# Patient Record
Sex: Male | Born: 1955 | Race: White | Hispanic: No | Marital: Married | Smoking: Never smoker
Health system: Southern US, Community
[De-identification: ages and names within clinical notes are randomized; demographics above are authoritative.]

## PROBLEM LIST (undated history)

## (undated) DIAGNOSIS — E7800 Pure hypercholesterolemia, unspecified: Secondary | ICD-10-CM

## (undated) DIAGNOSIS — M1990 Unspecified osteoarthritis, unspecified site: Secondary | ICD-10-CM

## (undated) DIAGNOSIS — I10 Essential (primary) hypertension: Secondary | ICD-10-CM

## (undated) DIAGNOSIS — F502 Bulimia nervosa, unspecified: Secondary | ICD-10-CM

## (undated) DIAGNOSIS — F419 Anxiety disorder, unspecified: Secondary | ICD-10-CM

## (undated) DIAGNOSIS — F319 Bipolar disorder, unspecified: Secondary | ICD-10-CM

## (undated) DIAGNOSIS — M48061 Spinal stenosis, lumbar region without neurogenic claudication: Secondary | ICD-10-CM

## (undated) DIAGNOSIS — Z87442 Personal history of urinary calculi: Secondary | ICD-10-CM

## (undated) DIAGNOSIS — K219 Gastro-esophageal reflux disease without esophagitis: Secondary | ICD-10-CM

## (undated) DIAGNOSIS — C439 Malignant melanoma of skin, unspecified: Secondary | ICD-10-CM

## (undated) DIAGNOSIS — Z8614 Personal history of Methicillin resistant Staphylococcus aureus infection: Secondary | ICD-10-CM

## (undated) DIAGNOSIS — G4730 Sleep apnea, unspecified: Secondary | ICD-10-CM

## (undated) HISTORY — PX: FOOT SURGERY: SHX648

## (undated) HISTORY — PX: ABDOMINAL SURGERY: SHX537

## (undated) HISTORY — PX: APPENDECTOMY: SHX54

## (undated) HISTORY — PX: SHOULDER SURGERY: SHX246

## (undated) HISTORY — PX: OTHER SURGICAL HISTORY: SHX169

## (undated) HISTORY — PX: EYE SURGERY: SHX253

## (undated) HISTORY — DX: Malignant melanoma of skin, unspecified: C43.9

## (undated) HISTORY — PX: VASECTOMY: SHX75

## (undated) HISTORY — DX: Spinal stenosis, lumbar region without neurogenic claudication: M48.061

---

## 2005-04-16 DIAGNOSIS — Z8614 Personal history of Methicillin resistant Staphylococcus aureus infection: Secondary | ICD-10-CM

## 2005-04-16 HISTORY — DX: Personal history of Methicillin resistant Staphylococcus aureus infection: Z86.14

## 2013-05-27 DIAGNOSIS — G4733 Obstructive sleep apnea (adult) (pediatric): Secondary | ICD-10-CM | POA: Insufficient documentation

## 2013-05-27 DIAGNOSIS — G5711 Meralgia paresthetica, right lower limb: Secondary | ICD-10-CM | POA: Insufficient documentation

## 2013-09-25 DIAGNOSIS — E669 Obesity, unspecified: Secondary | ICD-10-CM | POA: Insufficient documentation

## 2014-04-13 DIAGNOSIS — IMO0002 Reserved for concepts with insufficient information to code with codable children: Secondary | ICD-10-CM

## 2014-04-13 HISTORY — DX: Reserved for concepts with insufficient information to code with codable children: IMO0002

## 2015-01-17 DIAGNOSIS — Z85820 Personal history of malignant melanoma of skin: Secondary | ICD-10-CM | POA: Insufficient documentation

## 2016-11-18 ENCOUNTER — Inpatient Hospital Stay (HOSPITAL_COMMUNITY)
Admission: AD | Admit: 2016-11-18 | Discharge: 2016-11-21 | DRG: 885 | Disposition: A | Discharge status: Home / Self Care | Payer: 59 | Source: Intra-hospital | Attending: Psychiatry | Admitting: Psychiatry

## 2016-11-18 ENCOUNTER — Encounter (HOSPITAL_COMMUNITY): Payer: Self-pay

## 2016-11-18 ENCOUNTER — Emergency Department (HOSPITAL_COMMUNITY)
Admission: EM | Admit: 2016-11-18 | Discharge: 2016-11-18 | Disposition: A | Discharge status: Other Acute Inpatient Hospital | Payer: Self-pay | Attending: Emergency Medicine | Admitting: Emergency Medicine

## 2016-11-18 DIAGNOSIS — G4700 Insomnia, unspecified: Secondary | ICD-10-CM | POA: Diagnosis present

## 2016-11-18 DIAGNOSIS — I10 Essential (primary) hypertension: Secondary | ICD-10-CM | POA: Diagnosis present

## 2016-11-18 DIAGNOSIS — R45851 Suicidal ideations: Secondary | ICD-10-CM | POA: Insufficient documentation

## 2016-11-18 DIAGNOSIS — Z9049 Acquired absence of other specified parts of digestive tract: Secondary | ICD-10-CM | POA: Diagnosis not present

## 2016-11-18 DIAGNOSIS — F315 Bipolar disorder, current episode depressed, severe, with psychotic features: Principal | ICD-10-CM | POA: Diagnosis present

## 2016-11-18 DIAGNOSIS — F502 Bulimia nervosa: Secondary | ICD-10-CM | POA: Diagnosis present

## 2016-11-18 DIAGNOSIS — Z882 Allergy status to sulfonamides status: Secondary | ICD-10-CM | POA: Diagnosis not present

## 2016-11-18 DIAGNOSIS — Z79899 Other long term (current) drug therapy: Secondary | ICD-10-CM

## 2016-11-18 DIAGNOSIS — Z7982 Long term (current) use of aspirin: Secondary | ICD-10-CM

## 2016-11-18 DIAGNOSIS — Z9889 Other specified postprocedural states: Secondary | ICD-10-CM | POA: Diagnosis not present

## 2016-11-18 DIAGNOSIS — F17210 Nicotine dependence, cigarettes, uncomplicated: Secondary | ICD-10-CM | POA: Diagnosis present

## 2016-11-18 DIAGNOSIS — F329 Major depressive disorder, single episode, unspecified: Secondary | ICD-10-CM | POA: Insufficient documentation

## 2016-11-18 HISTORY — DX: Essential (primary) hypertension: I10

## 2016-11-18 HISTORY — DX: Pure hypercholesterolemia, unspecified: E78.00

## 2016-11-18 HISTORY — DX: Bulimia nervosa: F50.2

## 2016-11-18 HISTORY — DX: Bipolar disorder, unspecified: F31.9

## 2016-11-18 HISTORY — DX: Bulimia nervosa, unspecified: F50.20

## 2016-11-18 LAB — URINALYSIS, ROUTINE W REFLEX MICROSCOPIC
Bilirubin Urine: NEGATIVE
Glucose, UA: NEGATIVE mg/dL
Hgb urine dipstick: NEGATIVE
Ketones, ur: NEGATIVE mg/dL
Leukocytes, UA: NEGATIVE
Nitrite: NEGATIVE
Protein, ur: NEGATIVE mg/dL
Specific Gravity, Urine: 1.014 (ref 1.005–1.030)
pH: 5 (ref 5.0–8.0)

## 2016-11-18 LAB — RAPID URINE DRUG SCREEN, HOSP PERFORMED
Amphetamines: NOT DETECTED
Barbiturates: NOT DETECTED
Benzodiazepines: NOT DETECTED
Cocaine: NOT DETECTED
Opiates: NOT DETECTED
Tetrahydrocannabinol: NOT DETECTED

## 2016-11-18 LAB — COMPREHENSIVE METABOLIC PANEL
ALT: 23 U/L (ref 17–63)
AST: 23 U/L (ref 15–41)
Albumin: 4.2 g/dL (ref 3.5–5.0)
Alkaline Phosphatase: 43 U/L (ref 38–126)
Anion gap: 7 (ref 5–15)
BUN: 17 mg/dL (ref 6–20)
CO2: 25 mmol/L (ref 22–32)
Calcium: 9.3 mg/dL (ref 8.9–10.3)
Chloride: 107 mmol/L (ref 101–111)
Creatinine, Ser: 0.82 mg/dL (ref 0.61–1.24)
GFR calc Af Amer: >60 mL/min (ref >60)
GFR calc non Af Amer: >60 mL/min (ref >60)
Glucose, Bld: 101 mg/dL — ABNORMAL HIGH (ref 65–99)
Potassium: 3.9 mmol/L (ref 3.5–5.1)
Sodium: 139 mmol/L (ref 135–145)
Total Bilirubin: 0.7 mg/dL (ref 0.3–1.2)
Total Protein: 7.1 g/dL (ref 6.5–8.1)

## 2016-11-18 LAB — CBC
HCT: 41.3 % (ref 39.0–52.0)
Hemoglobin: 13.9 g/dL (ref 13.0–17.0)
MCH: 32.6 pg (ref 26.0–34.0)
MCHC: 33.7 g/dL (ref 30.0–36.0)
MCV: 96.9 fL (ref 78.0–100.0)
Platelets: 202 10*3/uL (ref 150–400)
RBC: 4.26 MIL/uL (ref 4.22–5.81)
RDW: 12.4 % (ref 11.5–15.5)
WBC: 4.8 10*3/uL (ref 4.0–10.5)

## 2016-11-18 LAB — ETHANOL: Alcohol, Ethyl (B): <5 mg/dL (ref <5)

## 2016-11-18 LAB — SALICYLATE LEVEL: Salicylate Lvl: <7 mg/dL (ref 2.8–30.0)

## 2016-11-18 LAB — ACETAMINOPHEN LEVEL: Acetaminophen (Tylenol), Serum: <10 ug/mL — ABNORMAL LOW (ref 10–30)

## 2016-11-18 MED ORDER — ACETAMINOPHEN 325 MG PO TABS
650.0000 mg | ORAL_TABLET | Freq: Four times a day (QID) | ORAL | Status: DC | PRN
  Administered 2016-11-19 – 2016-11-20 (×4): 650 mg via ORAL
  Filled 2016-11-18 (×3): qty 2

## 2016-11-18 MED ORDER — ENSURE ENLIVE PO LIQD: 237.0000 mL | Freq: Two times a day (BID) | ORAL | Status: DC

## 2016-11-18 MED ORDER — CARBAMAZEPINE ER 200 MG PO TB12
200.0000 mg | ORAL_TABLET | Freq: Two times a day (BID) | ORAL | Status: DC
  Administered 2016-11-18: 200 mg via ORAL
  Filled 2016-11-18 (×7): qty 1

## 2016-11-18 MED ORDER — MAGNESIUM HYDROXIDE 400 MG/5ML PO SUSP: 30.0000 mL | Freq: Every day | ORAL | Status: DC | PRN

## 2016-11-18 MED ORDER — PROSIGHT PO TABS
1.0000 | ORAL_TABLET | Freq: Every day | ORAL | Status: DC
  Administered 2016-11-19 – 2016-11-21 (×3): 1 via ORAL
  Filled 2016-11-18 (×5): qty 1

## 2016-11-18 MED ORDER — ADULT MULTIVITAMIN W/MINERALS CH
1.0000 | ORAL_TABLET | Freq: Every day | ORAL | Status: DC
  Administered 2016-11-19 – 2016-11-21 (×3): 1 via ORAL
  Filled 2016-11-18 (×5): qty 1

## 2016-11-18 MED ORDER — ASPIRIN EC 81 MG PO TBEC
81.0000 mg | DELAYED_RELEASE_TABLET | Freq: Every day | ORAL | Status: DC
  Administered 2016-11-19 – 2016-11-21 (×3): 81 mg via ORAL
  Filled 2016-11-18 (×5): qty 1

## 2016-11-18 MED ORDER — ESCITALOPRAM OXALATE 20 MG PO TABS
20.0000 mg | ORAL_TABLET | Freq: Every day | ORAL | Status: DC
  Administered 2016-11-19 – 2016-11-21 (×3): 20 mg via ORAL
  Filled 2016-11-18 (×5): qty 1

## 2016-11-18 MED ORDER — TRAZODONE HCL 50 MG PO TABS
50.0000 mg | ORAL_TABLET | Freq: Every evening | ORAL | Status: DC | PRN
  Administered 2016-11-18 – 2016-11-20 (×3): 50 mg via ORAL
  Filled 2016-11-18 (×2): qty 1

## 2016-11-18 MED ORDER — SIMVASTATIN 20 MG PO TABS
20.0000 mg | ORAL_TABLET | Freq: Every day | ORAL | Status: DC
  Administered 2016-11-19 – 2016-11-20 (×2): 20 mg via ORAL
  Filled 2016-11-18 (×4): qty 1

## 2016-11-18 MED ORDER — HYDROXYZINE HCL 25 MG PO TABS
25.0000 mg | ORAL_TABLET | Freq: Four times a day (QID) | ORAL | Status: DC | PRN
  Administered 2016-11-18: 25 mg via ORAL
  Filled 2016-11-18: qty 1

## 2016-11-18 MED ORDER — METOCLOPRAMIDE HCL 10 MG PO TABS
10.0000 mg | ORAL_TABLET | Freq: Once | ORAL | Status: AC
  Administered 2016-11-18: 10 mg via ORAL
  Filled 2016-11-18: qty 1

## 2016-11-18 MED ORDER — ALUM & MAG HYDROXIDE-SIMETH 200-200-20 MG/5ML PO SUSP: 30.0000 mL | ORAL | Status: DC | PRN

## 2016-11-18 NOTE — Progress Notes (Signed)
Pt reports he had 10 pills of Ativan 0.5 mg    He said it was taken at [facility] because he had it there and didn't have it when he got to [facility]

## 2016-11-18 NOTE — BH Assessment (Signed)
Tele Assessment Note   Calvin Cole is a 61 y.o. male who presented to [facility] on a voluntary basis with complaint of suicidal ideation with plan and other depressive symptoms.  Pt provided history.  He stated that he has a history of Bipolar I Disorder and is prescribed Lamictal, Ativan, and Tegretol for treatment (psychiatrist is with the [facility] in [place], [place]).  Pt said he is compliant with medication.  Pt stated that although he takes his medication, for the last week he has experienced significant depressive symptoms.  Pt endorsed the following:  Suicidal ideation with plan to overdose; persistent and unremitting despondency, insomnia (about 3 hours per night), mixed appetite, feelings of worthlessness and hopelessness, fatigue, isolation.  Pt also endorsed auditory hallucination -- voices telling him that he is worthless.  In addition to these symptoms, Pt endorsed ongoing bulimia.  Pt identified the following as triggers for his suicidal state:  Recent break-up with girlfriend, declining physical health (nerve issues around elbows), and work/financial stressors.  Pt now lives alone.  He works as a Biomedical scientist.  Social supports are limited -- he has one brother in the area and tends to isolate.  During assessment, Pt presented as alert and oriented.  He had good eye contact and was cooperative.  Pt was dressed in scrubs, and he appeared appropriately groomed.  Pt's mood and affect were depressed.  Pt endorsed suicidal ideation, auditory hallucination, and other depressive symptoms.  Pt denied homicidal ideation, visual hallucination, current self-injurious behavior (cut one time several years ago), and substance use concerns.  Pt's speech was normal in rate, rhythm, and volume.  Thought processes were within normal range, and thought content was logical and goal-oriented.  There was no evidence of delusion.  Memory and concentration were fair.  Ipulse control and insight were fair.   Judgment was poor.   Consulted with Starleen Arms, NP who recommended inpatient treatment due to suicidal ideation.  Diagnosis: Bipolar I, Depressed, Severe w/psychotic features; Bulimia (per report)  Past Medical History:  Past Medical History:  Diagnosis Date  . Bipolar 1 disorder ([place])   . Bulimia nervosa   . High cholesterol   . Hypertension     Past Surgical History:  Procedure Laterality Date  . ABDOMINAL SURGERY    . APPENDECTOMY    . EYE SURGERY    . SHOULDER SURGERY      Family History: No family history on file.  Social History:  reports that he has never smoked. He has never used smokeless tobacco. He reports that he does not drink alcohol or use drugs.  Additional Social History:  Alcohol / Drug Use Pain Medications: See MAR Prescriptions: See MAR Over the Counter: See MAR History of alcohol / drug use?: No history of alcohol / drug abuse  CIWA: CIWA-Ar BP: (!) 153/93 Pulse Rate: 98 COWS:    PATIENT STRENGTHS: (choose at least two) Average or above average intelligence Capable of independent living  Allergies:  Allergies  Allergen Reactions  . Sulfa Antibiotics Other (See Comments)    fever    Home Medications:  (Not in a hospital admission)  OB/GYN Status:  No LMP for male patient.  General Assessment Data Location of Assessment: [facility] TTS Assessment: In system Is this a Tele or Face-to-Face Assessment?: Tele Assessment Is this an Initial Assessment or a Re-assessment for this encounter?: Initial Assessment Marital status: Single Is patient pregnant?: No Pregnancy Status: No Living Arrangements: Alone Can pt return to current living  arrangement?: Yes Admission Status: Voluntary Is patient capable of signing voluntary admission?: Yes Referral Source: Self/Family/Friend Insurance type: [place] Living Arrangements: Alone Name of Psychiatrist: [employer] (In [place]) Name of Therapist: None  Education  Status Is patient currently in school?: No  Risk to self with the past 6 months Suicidal Ideation: Yes-Currently Present Has patient been a risk to self within the past 6 months prior to admission? : No Suicidal Intent: Yes-Currently Present Has patient had any suicidal intent within the past 6 months prior to admission? : No Is patient at risk for suicide?: Yes Suicidal Plan?: Yes-Currently Present Has patient had any suicidal plan within the past 6 months prior to admission? : No Specify Current Suicidal Plan: Overdose Access to Means: Yes Specify Access to Suicidal Means: Prescription meds What has been your use of drugs/alcohol within the last 12 months?: None Previous Attempts/Gestures: No How many times?: 0 Intentional Self Injurious Behavior: Cutting Comment - Self Injurious Behavior: 1 x cutting "a couple of years ago" Family Suicide History: No Recent stressful life event(s): Loss (Comment), Financial Problems, Recent negative physical changes (Broke up w/girlfriend; nerve issues in arms; work stressors) Persecutory voices/beliefs?: Yes Depression: Yes Depression Symptoms: Despondent, Insomnia, Tearfulness, Isolating, Fatigue, Guilt, Feeling worthless/self pity Substance abuse history and/or treatment for substance abuse?: No Suicide prevention information given to non-admitted patients: Not applicable  Risk to Others within the past 6 months Homicidal Ideation: No Does patient have any lifetime risk of violence toward others beyond the six months prior to admission? : No Thoughts of Harm to Others: No Current Homicidal Intent: No Current Homicidal Plan: No Access to Homicidal Means: No History of harm to others?: No Assessment of Violence: None Noted Does patient have access to weapons?: No Criminal Charges Pending?: No Does patient have a court date: No Is patient on probation?: No  Psychosis Hallucinations: Auditory Delusions: None noted  Mental Status  Report Appearance/Hygiene: In scrubs, Unremarkable Eye Contact: Good Motor Activity: Freedom of movement, Unremarkable Speech: Logical/coherent Level of Consciousness: Alert Mood: Depressed Affect: Depressed Anxiety Level: None Thought Processes: Coherent, Relevant Judgement: Impaired Orientation: Person, Place, Time, Situation Obsessive Compulsive Thoughts/Behaviors: None  Cognitive Functioning Concentration: Normal Memory: Remote Intact, Recent Intact IQ: Average Insight: Fair Impulse Control: Fair Appetite: Fair Sleep: Decreased Total Hours of Sleep: 3 Vegetative Symptoms: None  ADLScreening [facility] Assessment Services) Patient's cognitive ability adequate to safely complete daily activities?: Yes Patient able to express need for assistance with ADLs?: Yes Independently performs ADLs?: Yes (appropriate for developmental age)  Prior Inpatient Therapy Prior Inpatient Therapy: Yes Prior Therapy Dates: 2003 Prior Therapy Facilty/Provider(s): [facility] Reason for Treatment: Bipolar I  Prior Outpatient Therapy Prior Outpatient Therapy: Yes Prior Therapy Dates: Ongoing Prior Therapy Facilty/Provider(s): [facility] Reason for Treatment: Bipolar I Does patient have an ACCT team?: No Does patient have Intensive In-House Services?  : No Does patient have [facility] services? : No Does patient have P4CC services?: No  ADL Screening (condition at time of admission) Patient's cognitive ability adequate to safely complete daily activities?: Yes Is the patient deaf or have difficulty hearing?: No Does the patient have difficulty seeing, even when wearing glasses/contacts?: No Does the patient have difficulty concentrating, remembering, or making decisions?: No Patient able to express need for assistance with ADLs?: Yes Does the patient have difficulty dressing or bathing?: No Independently performs ADLs?: Yes (appropriate for developmental age) Does the patient have  difficulty walking or climbing stairs?: No Weakness  of Legs: None Weakness of Arms/Hands: None  Home Assistive Devices/Equipment Home Assistive Devices/Equipment: None  Therapy Consults (therapy consults require a physician order) PT Evaluation Needed: No OT Evalulation Needed: No SLP Evaluation Needed: No Abuse/Neglect Assessment (Assessment to be complete while patient is alone) Physical Abuse: Denies Verbal Abuse: Denies Sexual Abuse: Denies Exploitation of patient/patient's resources: Denies Self-Neglect: Denies Values / Beliefs Cultural Requests During Hospitalization: None Spiritual Requests During Hospitalization: None Consults Spiritual Care Consult Needed: No Social Work Consult Needed: No Regulatory affairs officer (For Healthcare) Does Patient Have a Medical Advance Directive?: No Would patient like information on creating a medical advance directive?: No - Patient declined    Additional Information 1:1 In Past 12 Months?: No CIRT Risk: No Elopement Risk: No Does patient have medical clearance?: Yes     Disposition:  Disposition Initial Assessment Completed for this Encounter: Yes Disposition of Patient: Inpatient treatment program Type of inpatient treatment program: Adult (Per L. Romilda Garret, NP, recommend I/P treatment)  Laurena Slimmer Clelia Trabucco 11/18/2016 3:29 PM

## 2016-11-18 NOTE — ED Notes (Signed)
Pt changed into purple scrubs, gave urine sample, wanded by security and belongings gave to the nurse. MD Mesner in room speaking with pt at this time.

## 2016-11-18 NOTE — Progress Notes (Signed)
Pt is a 61 year old male admitted with depression and suicidal ideation with a plan to overdose on medications   He reports having problems at work with his boss and loss of a relationship    He reports hopelessness and helplessness    He reports poor sleep and appetite that fluctuates from none to ravenous   He reports hearing voices that tell him he is no good    He said he has been taking his medications as prescribed   Pt was cooperative but slow during the assessment   He appears to be doing some thought blocking and is irritable   He contracts for safety while at [facility]   He does report problems with bulemia   Pt was oriented to the unit and provided with toiletries    Verbal support given    Medications administered and effectiveness monitored   Q 15 min checks implemented and explained    Education started  Pt is presently safe and receptive to verbal support

## 2016-11-18 NOTE — Tx Team (Signed)
Initial Treatment Plan 11/18/2016 10:20 PM Clester Chlebowski DSK:[id number]    PATIENT STRESSORS: Marital or family conflict Medication change or noncompliance Occupational concerns   PATIENT STRENGTHS: Average or above average intelligence Capable of independent living General fund of knowledge Motivation for treatment/growth   PATIENT IDENTIFIED PROBLEMS: "get help with depression"  "medications adjusted"                   DISCHARGE CRITERIA:  Improved stabilization in mood, thinking, and/or behavior Reduction of life-threatening or endangering symptoms to within safe limits Verbal commitment to aftercare and medication compliance  PRELIMINARY DISCHARGE PLAN: Attend aftercare/continuing care group Return to previous living arrangement Return to previous work or school arrangements  PATIENT/FAMILY INVOLVEMENT: This treatment plan has been presented to and reviewed with the patient, Tai Skelly, and/or family member, .  The patient and family have been given the opportunity to ask questions and make suggestions.  Migdalia Dk, RN 11/18/2016, 10:20 PM

## 2016-11-18 NOTE — ED Provider Notes (Signed)
  [place] DEPT Provider Note   CSN: [id number] Arrival date & time: 11/18/16  1146     History   Chief Complaint Chief Complaint  Patient presents with  . V70.1    HPI Iain Sawchuk is a 61 y.o. male.  HPI  61 yo M with multiple recent stressors now with worsening depression and suicidal ideation with plan to take all of his pills. Also has had some bulimia type symptoms that is not new.  No attempts in past. No HI. Told nurse about auditory hallucinations but did not mention to me.  No other associated/modifying symptoms.   Past Medical History:  Diagnosis Date  . Bipolar 1 disorder ([place])   . Bulimia nervosa   . High cholesterol   . Hypertension     There are no active problems to display for this patient.   Past Surgical History:  Procedure Laterality Date  . ABDOMINAL SURGERY    . APPENDECTOMY    . EYE SURGERY    . SHOULDER SURGERY         Home Medications    Prior to Admission medications   Not on File    Family History No family history on file.  Social History Social History  Substance Use Topics  . Smoking status: Never Smoker  . Smokeless tobacco: Never Used  . Alcohol use No     Allergies   Sulfa antibiotics   Review of Systems Review of Systems  All other systems reviewed and are negative.    Physical Exam Updated Vital Signs BP (!) 153/93 (BP Location: Right Arm)   Pulse 98   Temp 98.2 F (36.8 C) (Oral)   Resp 18   Ht 6\' 1"  (1.854 m)   Wt 99.8 kg (220 lb)   SpO2 99%   BMI 29.03 kg/m   Physical Exam  Constitutional: He appears well-developed and well-nourished.  HENT:  Head: Normocephalic and atraumatic.  Eyes: Conjunctivae and EOM are normal.  Neck: Normal range of motion.  Cardiovascular: Normal rate.   Pulmonary/Chest: Effort normal. No respiratory distress.  Abdominal: Soft. He exhibits no distension. There is no tenderness. There is no guarding.  Musculoskeletal: Normal range of motion.  Neurological:  He is alert.  Skin: Skin is warm and dry. No rash noted.  Nursing note and vitals reviewed.    ED Treatments / Results  Labs (all labs ordered are listed, but only abnormal results are displayed) Labs Reviewed - No data to display  EKG  EKG Interpretation None       Radiology No results found.  Procedures Procedures (including critical care time)  Medications Ordered in ED Medications - No data to display   Initial Impression / Assessment and Plan / ED Course  I have reviewed the triage vital signs and the nursing notes.  Pertinent labs & imaging results that were available during my care of the patient were reviewed by me and considered in my medical decision making (see chart for details).  With h/o bulimia he appears well nourished and well hydrated but will check labs prior to medical clearance to ensure no severe electrolyte disturbances.      Labs ok. Stable. Is medically cleared for TTS consultation.   TTS/[facility] recommending inpatient treatment.   Final Clinical Impressions(s) / ED Diagnoses   Final diagnoses:  None     Wladyslaw Henrichs, Corene Cornea, MD 11/18/16 1701

## 2016-11-18 NOTE — Progress Notes (Addendum)
Pt has been accepted to [facility] inpatient room assignment is 307-1. Accepting provider is Jinny Blossom NP. Attending is DR. Fernando Cobos. The number for report is [phone]. Pt must sign voluntary consent and fax back to [phone], otherwise IVC would need to be initiated. Pt must be medically cleared prior to transfer, repeat B/P value requested. Request pt arrive at 830 pm. This was relayed to [employer].

## 2016-11-18 NOTE — ED Notes (Signed)
Pt offered lunch tray, but stated he did not want anything.

## 2016-11-18 NOTE — ED Triage Notes (Addendum)
Patient here from work. States "I cant take it anymore". States his girlfriend broke up with him last week, his boss is causing a lot of work stressors and having issues with Bulimia. Reports of having diarrhea. Patient states his plan is to go home and "take all my pills". Patient tearful in triage. Denies HI. Reports of having auditory hallucinations that stated "Stuid, idiot, failure". States he hears sounds at nighttime and not sleeping well.   Patient wanded by security.

## 2016-11-19 DIAGNOSIS — R45851 Suicidal ideations: Secondary | ICD-10-CM

## 2016-11-19 DIAGNOSIS — R440 Auditory hallucinations: Secondary | ICD-10-CM

## 2016-11-19 DIAGNOSIS — R5383 Other fatigue: Secondary | ICD-10-CM

## 2016-11-19 MED ORDER — CARBAMAZEPINE 200 MG PO TABS
600.0000 mg | ORAL_TABLET | Freq: Two times a day (BID) | ORAL | Status: DC
  Administered 2016-11-19 – 2016-11-21 (×4): 600 mg via ORAL
  Filled 2016-11-19 (×10): qty 3

## 2016-11-19 MED ORDER — ENSURE ENLIVE PO LIQD: 237.0000 mL | Freq: Two times a day (BID) | ORAL | Status: DC | PRN

## 2016-11-19 MED ORDER — LITHIUM CARBONATE ER 450 MG PO TBCR
450.0000 mg | EXTENDED_RELEASE_TABLET | Freq: Every day | ORAL | Status: DC
  Filled 2016-11-19 (×5): qty 1

## 2016-11-19 NOTE — Progress Notes (Signed)
  Recreation Therapy Notes  Date: 11/19/2016 Time: 9:30am Location: [address] Dayroom  Group Topic: Stress Management  Goal Area(s) Addresses:  Patient will verbalize importance of using healthy stress management.  Patient will identify positive emotions associated with healthy stress management.   Behavioral Response: Engaged  Intervention: Stress Management  Activity :  Guided Body Scan. Recreation Therapy Intern introduced the stress management technique of guided body scans. Recreation Therapy Intern played a YouTube video that allowed patients to focus on the tension built up in each part of their body. Patients were to follow along as script was read to engage in the activity.  Education: Stress Management, Discharge Planning.   Education Outcome: Acknowledges edcuation  Clinical Observations/Feedback: Pt attended group.  Calvin Cole, Recreation Therapy Intern   Calvin Cole, LRT/CTRS

## 2016-11-19 NOTE — [facility] Group Notes (Signed)
[facility] LCSW Aftercare Discharge Planning Group Note   Date/time: 11/19/2016 9:30 AM  Type of Group and Topic: Psychoeducational Group:  Discharge Planning  Participation Level:  Minimal  Description of Group  Discharge planning group reviews patient's anticipated discharge plans and assists patients to anticipate and address any barriers to wellness/recovery in the community.  Suicide prevention education is reviewed with patients in group.  Therapeutic Goals 1. Patients will state their anticipated discharge plan and mental health aftercare 2. Patients will identify potential barriers to wellness in the community setting 3. Patients will engage in problem solving, solution focused discussion of ways to anticipate and address barriers to wellness/recovery  Summary of Patient Progress New to unit, working on his plan.  Aware of appointment at [facility] in [place], wants therapist in [place] as he has recently moved.    Plan for Discharge/Comments:  Return home, follow up outpatient  Transportation Means: TBD  Supports: TBD  Therapeutic Modalities: Motivational Interviewing   Edwyna Shell, [place] Social Worker Phone:  [phone]

## 2016-11-19 NOTE — [facility] Suicide Risk Assessment (Signed)
[facility] Admission Suicide Risk Assessment   Nursing information obtained from:    Demographic factors:    Current Mental Status:    Loss Factors:    Historical Factors:    Risk Reduction Factors:     Total Time spent with patient: 30 minutes Principal Problem: <principal problem not specified> Diagnosis:   Patient Active Problem List   Diagnosis Date Noted  . Bipolar affective disorder, depressed, severe, with psychotic behavior ([place]) [F31.5] 11/18/2016   Subjective Data:  61 yo Caucasian male, divorced, lives alone, employed as a Biomedical scientist. Long history of Bipolar Disorder and Bulimia. Has been tried on multiple agents over the years. Recently has been stable on combination of Tegretol and Lexapro. He had been doing well until a couple of weeks ago. Has had some stressors "my girlfriend broke up with me ,,,,, my boss has been breathing down my neck ,,,,, we have joint commission coming to my place of work ,,,,,, I have a lot of health issues". Says he has been ruminating a lot on all the negatives over th years " my divorce ,,,, my older son would not talk to me ". When under stress, he tends to over eat and then induce vomiting. He was at work yesterday but it had been a struggle. He has been having suicidal thoughts. No homicidal thoughts. No thoughts of violence.  No associated psychosis. No manic symptoms. No substance use. Came in voluntarily to get help. No past suicidal behavior. Has had manic episodes in the past. Multiple hospitalization over the years. Trials of Abilify, Citalopram, Prozac, Sertraline, Lamictal and Depakote. Brief use of Lithium but could not tolerate the high dose as he had GI symptoms.  We explored treatment options. We agreed to maintain his current home medications and add low dose of Lithium CR. We discussed the risks and benefits. Patient consented to treatment.  Continued Clinical Symptoms:  Alcohol Use Disorder Identification Test Final Score (AUDIT): 0 The "Alcohol  Use Disorders Identification Test", Guidelines for Use in Primary Care, Second Edition.  World Pharmacologist [facility]). Score between 0-7:  no or low risk or alcohol related problems. Score between 8-15:  moderate risk of alcohol related problems. Score between 16-19:  high risk of alcohol related problems. Score 20 or above:  warrants further diagnostic evaluation for alcohol dependence and treatment.   CLINICAL FACTORS:   Bipolar Disorder   Musculoskeletal: Strength & Muscle Tone: within normal limits Gait & Station: normal Patient leans: N/A  Psychiatric Specialty Exam: Physical Exam  Constitutional: He is oriented to person, place, and time. He appears well-developed and well-nourished.  HENT:  Head: Normocephalic and atraumatic.  Neck: Normal range of motion. Neck supple.  Respiratory: Effort normal.  Neurological: He is alert and oriented to person, place, and time.  Skin: Skin is warm and dry.  Psychiatric:  As above     ROS  Blood pressure 119/77, pulse 78, temperature 97.8 F (36.6 C), temperature source Oral, resp. rate 18, height 6\' 1"  (1.854 m), weight 99.8 kg (220 lb).Body mass index is 29.03 kg/m.  General Appearance: Neatly dressed, slightly irritable. Good rapport. Appropriate behavior.   Eye Contact:  Good  Speech:  Clear and Coherent and Normal Rate  Volume:  Normal  Mood:  Depressed  Affect:  Congruent  Thought Process:  Linear  Orientation:  Full (Time, Place, and Person)  Thought Content:  Negative ruminations about his life.  No delusional theme. No preoccupation with violent thoughts.  No hallucination in  any modality.   Suicidal Thoughts:  Yes.  with intent/plan  Homicidal Thoughts:  No  Memory:  Immediate;   Fair Recent;   Fair Remote;   Good  Judgement:  Fair  Insight:  Good  Psychomotor Activity:  Normal  Concentration:  Concentration: Fair and Attention Span: Fair  Recall:  Good  Fund of Knowledge:  Good  Language:  Good  Akathisia:   Negative  Handed:    AIMS (if indicated):     Assets:  Communication Skills Desire for Improvement Financial Resources/Insurance Housing Talents/Skills Vocational/Educational  ADL's:  Intact  Cognition:  WNL  Sleep:  Number of Hours: 6.75      COGNITIVE FEATURES THAT CONTRIBUTE TO RISK:  None    SUICIDE RISK:   Moderate:  Frequent suicidal ideation with limited intensity, and duration, some specificity in terms of plans, no associated intent, good self-control, limited dysphoria/symptomatology, some risk factors present, and identifiable protective factors, including available and accessible social support.  PLAN OF CARE:  1. Suicide precautions 2. Continue Tegretol 600 mg BID  3. Continue Lexapro 20 mg daily 4. Add Lithium CR 450 mg at bedtime.   I certify that inpatient services furnished can reasonably be expected to improve the patient's condition.   [place], MD 11/19/2016, 3:27 PM

## 2016-11-19 NOTE — Progress Notes (Signed)
Patient ID: Calvin Cole, male   DOB: 29-Dec-1955, 61 y.o.   MRN: [id number]  D: Patient denies SI/HI and auditory and visual hallucinations. Patient has a depressed mood and affect. Patient complained of L elbow pain from tennis elbow and ulnar entrapment. Patient refused Tegretol this AM stating that the dose was less than he was accustomed to taking.  A: Patient given emotional support from RN. Patient given medications per MD orders. Patient given PRN for pain. MD notified of patients refusal of Tegretol. Patient encouraged to attend groups and unit activities. Patient encouraged to come to staff with any questions or concerns.  R: Patient remains cooperative and appropriate. Will continue to monitor patient for safety and pain.
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
## 2016-11-19 NOTE — BHH Group Notes (Addendum)
[place] LCSW Group Therapy Note  Date/Time 11/19/2016 1:30 PM  Type of Therapy/Topic:  Group Therapy:  Balance in Life  Participation Level:  Invited, did not attend  Description of Group:    This group will address the concept of balance and how it feels and looks when one is unbalanced. Patients will be encouraged to process areas in their lives that are out of balance, and identify reasons for remaining unbalanced. Facilitators will guide patients utilizing problem- solving interventions to address and correct the stressor making their life unbalanced. Understanding and applying boundaries will be explored and addressed for obtaining  and maintaining a balanced life. Patients will be encouraged to explore ways to assertively make their unbalanced needs known to significant others in their lives, using other group members and facilitator for support and feedback.  Therapeutic Goals: 1. Patient will identify two or more emotions or situations they have that consume much of in their lives. 2. Patient will identify signs/triggers that life has become out of balance:  3. Patient will identify two ways to set boundaries in order to achieve balance in their lives:  4. Patient will demonstrate ability to communicate their needs through discussion and/or role plays  Summary of Patient Progress:  NA  Therapeutic Modalities:   Cognitive Behavioral Therapy Solution-Focused Therapy Assertiveness Training  Edwyna Shell, [place] Social Worker Phone:  [phone]

## 2016-11-19 NOTE — Tx Team (Signed)
Interdisciplinary Treatment and Diagnostic Plan Update  11/19/2016 Time of Session: [place] MRN: [id number]  Principal Diagnosis: Bipolar Disorder  Secondary Diagnoses: Active Problems:   Bipolar affective disorder, depressed, severe, with psychotic behavior ([place])   Current Medications:  Current Facility-Administered Medications  Medication Dose Route Frequency Provider Last Rate Last Dose  . acetaminophen (TYLENOL) tablet 650 mg  650 mg Oral Q6H PRN Ethelene Hal, NP      . alum & mag hydroxide-simeth (MAALOX/MYLANTA) 200-200-20 MG/5ML suspension 30 mL  30 mL Oral Q4H PRN Ethelene Hal, NP      . aspirin EC tablet 81 mg  81 mg Oral Daily Ethelene Hal, NP   81 mg at 11/19/16 0810  . carbamazepine (TEGRETOL XR) 12 hr tablet 200 mg  200 mg Oral BID Ethelene Hal, NP   200 mg at 11/18/16 2151  . escitalopram (LEXAPRO) tablet 20 mg  20 mg Oral Daily Ethelene Hal, NP   20 mg at 11/19/16 4627  . feeding supplement (ENSURE ENLIVE) (ENSURE ENLIVE) liquid 237 mL  237 mL Oral BID BM Cobos, Fernando A, MD      . hydrOXYzine (ATARAX/VISTARIL) tablet 25 mg  25 mg Oral Q6H PRN Ethelene Hal, NP   25 mg at 11/18/16 2151  . magnesium hydroxide (MILK OF MAGNESIA) suspension 30 mL  30 mL Oral Daily PRN Ethelene Hal, NP      . multivitamin (PROSIGHT) tablet 1 tablet  1 tablet Oral Daily Ethelene Hal, NP   1 tablet at 11/19/16 0350  . multivitamin with minerals tablet 1 tablet  1 tablet Oral Daily Ethelene Hal, NP   1 tablet at 11/19/16 [phone]  . simvastatin (ZOCOR) tablet 20 mg  20 mg Oral q1800 Ethelene Hal, NP      . traZODone (DESYREL) tablet 50 mg  50 mg Oral QHS PRN Ethelene Hal, NP   50 mg at 11/18/16 2151   PTA Medications: Prescriptions Prior to Admission  Medication Sig Dispense Refill Last Dose  . aspirin EC 81 MG tablet Take 81 mg by mouth daily.   11/18/2016 at Unknown time  . carbamazepine  (TEGRETOL) 200 MG tablet   0 11/18/2016 at Unknown time  . escitalopram (LEXAPRO) 20 MG tablet TK 1 T PO QD  0 11/18/2016 at Unknown time  . LORazepam (ATIVAN) 0.5 MG tablet Patient says he took 3 tablets  0 11/18/2016 at Unknown time  . Multiple Vitamin (MULTIVITAMIN) tablet Take 1 tablet by mouth daily.   11/18/2016 at Unknown time  . Multiple Vitamins-Minerals (EYE VITAMINS PO) Take 1 tablet by mouth daily.   11/17/2016 at Unknown time  . simvastatin (ZOCOR) 20 MG tablet TK 1 T PO Q NIGHT  1 11/18/2016 at Unknown time    Patient Stressors: Marital or family conflict Medication change or noncompliance Occupational concerns  Patient Strengths: Average or above average intelligence Capable of independent living General fund of knowledge Motivation for treatment/growth  Treatment Modalities: Medication Management, Group therapy, Case management,  1 to 1 session with clinician, Psychoeducation, Recreational therapy.   Physician Treatment Plan for Primary Diagnosis: Bipolar Disorder  Medication Management: Evaluate patient's response, side effects, and tolerance of medication regimen.  Therapeutic Interventions: 1 to 1 sessions, Unit Group sessions and Medication administration.  Evaluation of Outcomes: Not Met  Physician Treatment Plan for Secondary Diagnosis: Active Problems:   Bipolar affective disorder, depressed, severe, with psychotic behavior ([place])  Medication Management: Evaluate patient's  response, side effects, and tolerance of medication regimen.  Therapeutic Interventions: 1 to 1 sessions, Unit Group sessions and Medication administration.  Evaluation of Outcomes: Not Met   RN Treatment Plan for Primary Diagnosis: Bipolar Disorder Long Term Goal(s): Knowledge of disease and therapeutic regimen to maintain health will improve  Short Term Goals: Ability to remain free from injury will improve, Ability to participate in decision making will improve and Ability to verbalize  feelings will improve  Medication Management: RN will administer medications as ordered by provider, will assess and evaluate patient's response and provide education to patient for prescribed medication. RN will report any adverse and/or side effects to prescribing provider.  Therapeutic Interventions: 1 on 1 counseling sessions, Psychoeducation, Medication administration, Evaluate responses to treatment, Monitor vital signs and CBGs as ordered, Perform/monitor CIWA, COWS, AIMS and Fall Risk screenings as ordered, Perform wound care treatments as ordered.  Evaluation of Outcomes: Not Met   LCSW Treatment Plan for Primary Diagnosis: Bipolar Disorder Long Term Goal(s): Safe transition to appropriate next level of care at discharge, Engage patient in therapeutic group addressing interpersonal concerns.  Short Term Goals: Engage patient in aftercare planning with referrals and resources, Facilitate acceptance of mental health diagnosis and concerns and Facilitate patient progression through stages of change regarding substance use diagnoses and concerns  Therapeutic Interventions: Assess for all discharge needs, 1 to 1 time with Social worker, Explore available resources and support systems, Assess for adequacy in community support network, Educate family and significant other(s) on suicide prevention, Complete Psychosocial Assessment, Interpersonal group therapy.  Evaluation of Outcomes: Not Met   Progress in Treatment: Attending groups: Yes. Participating in groups: Yes. Taking medication as prescribed: Yes. Toleration medication: Yes. Family/Significant other contact made: No, will contact:  family member if patient consents Patient understands diagnosis: Yes. Discussing patient identified problems/goals with staff: Yes. Medical problems stabilized or resolved: Yes. Denies suicidal/homicidal ideation: Yes. Issues/concerns per patient self-inventory: No. Other: n/a  New problem(s)  identified: No, Describe:  n/a  New Short Term/Long Term Goal(s): detox, medication management for mood stabilization; development of comprehensive mental wellness/sobriety plan.   Discharge Plan or Barriers: CSW assessing for appropriate referrals.   Reason for Continuation of Hospitalization: Depression Medication stabilization Suicidal ideation Withdrawal symptoms  Estimated Length of Stay: Wed 11/21/16  Attendees: Patient: 11/19/2016 8:26 AM  Physician: Dr. Sanjuana Letters MD 11/19/2016 8:26 AM  Nursing: Selinda Eon RN; Dan RN 11/19/2016 8:26 AM  RN Care Manager: Lars Pinks CM 11/19/2016 8:26 AM  Social Worker: Maxie Better, LCSW 11/19/2016 8:26 AM  Recreational Therapist: x 11/19/2016 8:26 AM  Other: Lindell Spar NP; Ricky Ala NP; Darnelle Maffucci Money NP 11/19/2016 8:26 AM  Other:  11/19/2016 8:26 AM  Other: 11/19/2016 8:26 AM    Scribe for Treatment Team: [place], LCSW 11/19/2016 8:26 AM

## 2016-11-19 NOTE — Progress Notes (Signed)
NUTRITION ASSESSMENT  Pt identified as at risk on the Malnutrition Screen Tool  INTERVENTION: 1. Supplements: Ensure Enlive po BID PRN, each supplement provides 350 kcal and 20 grams of protein   NUTRITION DIAGNOSIS: Unintentional weight loss related to sub-optimal intake as evidenced by pt report.   Goal: Pt to meet >/= 90% of their estimated nutrition needs.  Monitor:  PO intake  Assessment:  Pt admitted with bipolar disorder and history of bulimia nervosa. Pt states his appetite fluctuates. Weight has remained stable. RD changed order for Ensure to PRN, in the case that the pt skips meals or has poor PO intake.  Height: Ht Readings from Last 1 Encounters:  11/18/16 6\' 1"  (1.854 m)    Weight: Wt Readings from Last 1 Encounters:  11/18/16 220 lb (99.8 kg)    Weight Hx: Wt Readings from Last 10 Encounters:  11/18/16 220 lb (99.8 kg)  11/18/16 220 lb (99.8 kg)    BMI:  Body mass index is 29.03 kg/m. Pt meets criteria for overweight based on current BMI.  Estimated Nutritional Needs: Kcal: 25-30 kcal/kg Protein: > 1 gram protein/kg Fluid: 1 ml/kcal  Diet Order: Diet regular Room service appropriate? No; Fluid consistency: Thin Pt is also offered choice of unit snacks mid-morning and mid-afternoon.  Pt is eating as desired.   Lab results and medications reviewed.   Clayton Bibles, MS, RD, LDN Pager: [phone] After Hours Pager: [phone]

## 2016-11-19 NOTE — Progress Notes (Signed)
[place] Group Notes:  (Nursing/MHT/Case Management/Adjunct)  Date:  11/19/2016  Time:  11:42 PM  Type of Therapy:  Psychoeducational Skills  Participation Level:  Active  Participation Quality:  Appropriate  Affect:  Appropriate  Cognitive:  Appropriate  Insight:  Limited  Engagement in Group:  Limited  Modes of Intervention:  Education  Summary of Progress/Problems: Patient states that he felt "fine" and that he was experiencing pain in his elbows. As for the theme of the day, his wellness strategy will be to work on "not getting agitated".   Archie Balboa S 11/19/2016, 11:42 PM

## 2016-11-19 NOTE — Plan of Care (Signed)
Problem: Medication: Goal: Compliance with prescribed medication regimen will improve Outcome: Progressing Patient has been compliant with meds.

## 2016-11-19 NOTE — Plan of Care (Signed)
Problem: Safety: Goal: Periods of time without injury will increase Outcome: Progressing Pt has not harmed self or others tonight.  He denies SI/HI and verbally contracts for safety.   

## 2016-11-19 NOTE — BHH Group Notes (Signed)
Pt attended spiritual care group on grief and loss facilitated by chaplain Jerene Pitch   Group opened with brief discussion and psycho-social ed around grief and loss in relationships and in relation to self - identifying life patterns, circumstances, changes that cause losses. Established group norm of speaking from own life experience. Group goal of establishing open and affirming space for members to share loss and experience with grief, normalize grief experience and provide psycho social education and grief support.   Calvin Cole was present throughout group.  Attentive to other group members.  Did not engage until end of group.  Offered affirmation and support and engaged another group member around family system.   Encouraged other group member in care for himself, not taking on too much care for his parents.    WL / [place], MDiv

## 2016-11-19 NOTE — H&P (Signed)
Psychiatric Admission Assessment Adult  Patient Identification: Calvin Cole MRN:  [id number] Date of Evaluation:  11/19/2016 Chief Complaint:  Bipolar I depressed with psychotic features History of Bulimia per pt report Principal Diagnosis: <principal problem not specified> Diagnosis:   Patient Active Problem List   Diagnosis Date Noted  . Bipolar affective disorder, depressed, severe, with psychotic behavior ([place]) [F31.5] 11/18/2016   History of Present Illness: Per assessment note-Calvin Cole is a 61 y.o. male who presented to [facility] on a voluntary basis with complaint of suicidal ideation with plan and other depressive symptoms.  Pt provided history.  He stated that he has a history of Bipolar I Disorder and is prescribed Lamictal, Ativan, and Tegretol for treatment (psychiatrist is with the [facility] in [place], [place]).  Pt said he is compliant with medication.  Pt stated that although he takes his medication, for the last week he has experienced significant depressive symptoms.  Pt endorsed the following:  Suicidal ideation with plan to overdose; persistent and unremitting despondency, insomnia (about 3 hours per night), mixed appetite, feelings of worthlessness and hopelessness, fatigue, isolation.  Pt also endorsed auditory hallucination -- voices telling him that he is worthless.  In addition to these symptoms, Pt endorsed ongoing bulimia.  Pt identified the following as triggers for his suicidal state:  Recent break-up with girlfriend, declining physical health (nerve issues around elbows), and work/financial stressors.  Pt now lives alone.  He works as a Biomedical scientist.  Social supports are limited -- he has one brother in the area and tends to isolate.  During assessment, Pt presented as alert and oriented.  He had good eye contact and was cooperative.  Pt was dressed in scrubs, and he appeared appropriately groomed.  Pt's mood and affect were depressed.  Pt endorsed suicidal  ideation, auditory hallucination, and other depressive symptoms.  Pt denied homicidal ideation, visual hallucination, current self-injurious behavior (cut one time several years ago), and substance use concerns.  Pt's speech was normal in rate, rhythm, and volume.  Thought processes were within normal range, and thought content was logical and goal-oriented.  There was no evidence of delusion.  Memory and concentration were fair.  Ipulse control and insight were fair.  Judgment was poor.  Associated Signs/Symptoms: Depression Symptoms:  depressed mood, suicidal attempt, anxiety, (Hypo) Manic Symptoms:  Distractibility, Impulsivity, Irritable Mood, Anxiety Symptoms:  Excessive Worry, Psychotic Symptoms:  Hallucinations: None PTSD Symptoms: Avoidance:  Decreased Interest/Participation Total Time spent with patient: 30 minutes  Past Psychiatric History:   Is the patient at risk to self? Yes.    Has the patient been a risk to self in the past 6 months? Yes.    Has the patient been a risk to self within the distant past? Yes.    Is the patient a risk to others? No.  Has the patient been a risk to others in the past 6 months? No.  Has the patient been a risk to others within the distant past? No.   Prior Inpatient Therapy:   Prior Outpatient Therapy:    Alcohol Screening: 1. How often do you have a drink containing alcohol?: Never 2. How many drinks containing alcohol do you have on a typical day when you are drinking?: 1 or 2 3. How often do you have six or more drinks on one occasion?: Never Preliminary Score: 0 5. How often during the last year have you failed to do what was normally expected from you becasue of drinking?: Never 6. How  often during the last year have you needed a first drink in the morning to get yourself going after a heavy drinking session?: Never 7. How often during the last year have you had a feeling of guilt of remorse after drinking?: Never 8. How often during  the last year have you been unable to remember what happened the night before because you had been drinking?: Never 9. Have you or someone else been injured as a result of your drinking?: No 10. Has a relative or friend or a doctor or another health worker been concerned about your drinking or suggested you cut down?: No Alcohol Use Disorder Identification Test Final Score (AUDIT): 0 Brief Intervention: AUDIT score less than 7 or less-screening does not suggest unhealthy drinking-brief intervention not indicated Substance Abuse History in the last 12 months:  No. Consequences of Substance Abuse: NA Previous Psychotropic Medications: YES Psychological Evaluations: No  Past Medical History:  Past Medical History:  Diagnosis Date  . Bipolar 1 disorder ([place])   . Bulimia nervosa   . High cholesterol   . Hypertension     Past Surgical History:  Procedure Laterality Date  . ABDOMINAL SURGERY    . APPENDECTOMY    . EYE SURGERY    . SHOULDER SURGERY     Family History: History reviewed. No pertinent family history. Family Psychiatric  History:  Tobacco Screening: Have you used any form of tobacco in the last 30 days? (Cigarettes, Smokeless Tobacco, Cigars, and/or Pipes): No Tobacco use, Select all that apply: 4 or less cigarettes per day Are you interested in Tobacco Cessation Medications?: No, patient refused Counseled patient on smoking cessation including recognizing danger situations, developing coping skills and basic information about quitting provided: Refused/Declined practical counseling Social History:  History  Alcohol Use No    Comment: Pt denied     History  Drug Use No    Comment: Pt denied    Additional Social History:      Pain Medications: See MAR Prescriptions: See MAR Over the Counter: See MAR History of alcohol / drug use?: No history of alcohol / drug abuse                    Allergies:   Allergies  Allergen Reactions  . Sulfa Antibiotics Other  (See Comments)    fever   Lab Results:  Results for orders placed or performed during the hospital encounter of 11/18/16 (from the past 48 hour(s))  Rapid urine drug screen (hospital performed)     Status: None   Collection Time: 11/18/16 12:23 PM  Result Value Ref Range   Opiates NONE DETECTED NONE DETECTED   Cocaine NONE DETECTED NONE DETECTED   Benzodiazepines NONE DETECTED NONE DETECTED   Amphetamines NONE DETECTED NONE DETECTED   Tetrahydrocannabinol NONE DETECTED NONE DETECTED   Barbiturates NONE DETECTED NONE DETECTED    Comment:        DRUG SCREEN FOR MEDICAL PURPOSES ONLY.  IF CONFIRMATION IS NEEDED FOR ANY PURPOSE, NOTIFY LAB WITHIN 5 DAYS.        LOWEST DETECTABLE LIMITS FOR URINE DRUG SCREEN Drug Class       Cutoff (ng/mL) Amphetamine      1000 Barbiturate      200 Benzodiazepine   914 Tricyclics       782 Opiates          300 Cocaine          300 THC  50   Urinalysis, Routine w reflex microscopic     Status: None   Collection Time: 11/18/16 12:23 PM  Result Value Ref Range   Color, Urine YELLOW YELLOW   APPearance CLEAR CLEAR   Specific Gravity, Urine 1.014 1.005 - 1.030   pH 5.0 5.0 - 8.0   Glucose, UA NEGATIVE NEGATIVE mg/dL   Hgb urine dipstick NEGATIVE NEGATIVE   Bilirubin Urine NEGATIVE NEGATIVE   Ketones, ur NEGATIVE NEGATIVE mg/dL   Protein, ur NEGATIVE NEGATIVE mg/dL   Nitrite NEGATIVE NEGATIVE   Leukocytes, UA NEGATIVE NEGATIVE  Comprehensive metabolic panel     Status: Abnormal   Collection Time: 11/18/16 12:29 PM  Result Value Ref Range   Sodium 139 135 - 145 mmol/L   Potassium 3.9 3.5 - 5.1 mmol/L   Chloride 107 101 - 111 mmol/L   CO2 25 22 - 32 mmol/L   Glucose, Bld 101 (H) 65 - 99 mg/dL   BUN 17 6 - 20 mg/dL   Creatinine, Ser 0.82 0.61 - 1.24 mg/dL   Calcium 9.3 8.9 - 10.3 mg/dL   Total Protein 7.1 6.5 - 8.1 g/dL   Albumin 4.2 3.5 - 5.0 g/dL   AST 23 15 - 41 U/L   ALT 23 17 - 63 U/L   Alkaline Phosphatase 43 38 - 126  U/L   Total Bilirubin 0.7 0.3 - 1.2 mg/dL   GFR calc non Af Amer >60 >60 mL/min   GFR calc Af Amer >60 >60 mL/min    Comment: (NOTE) The eGFR has been calculated using the CKD EPI equation. This calculation has not been validated in all clinical situations. eGFR's persistently <60 mL/min signify possible Chronic Kidney Disease.    Anion gap 7 5 - 15  Ethanol     Status: None   Collection Time: 11/18/16 12:29 PM  Result Value Ref Range   Alcohol, Ethyl (B) <5 <5 mg/dL    Comment:        LOWEST DETECTABLE LIMIT FOR SERUM ALCOHOL IS 5 mg/dL FOR MEDICAL PURPOSES ONLY   Salicylate level     Status: None   Collection Time: 11/18/16 12:29 PM  Result Value Ref Range   Salicylate Lvl <6.3 2.8 - 30.0 mg/dL  Acetaminophen level     Status: Abnormal   Collection Time: 11/18/16 12:29 PM  Result Value Ref Range   Acetaminophen (Tylenol), Serum <10 (L) 10 - 30 ug/mL    Comment:        THERAPEUTIC CONCENTRATIONS VARY SIGNIFICANTLY. A RANGE OF 10-30 ug/mL MAY BE AN EFFECTIVE CONCENTRATION FOR MANY PATIENTS. HOWEVER, SOME ARE BEST TREATED AT CONCENTRATIONS OUTSIDE THIS RANGE. ACETAMINOPHEN CONCENTRATIONS >150 ug/mL AT 4 HOURS AFTER INGESTION AND >50 ug/mL AT 12 HOURS AFTER INGESTION ARE OFTEN ASSOCIATED WITH TOXIC REACTIONS.   cbc     Status: None   Collection Time: 11/18/16 12:29 PM  Result Value Ref Range   WBC 4.8 4.0 - 10.5 K/uL   RBC 4.26 4.22 - 5.81 MIL/uL   Hemoglobin 13.9 13.0 - 17.0 g/dL   HCT 41.3 39.0 - 52.0 %   MCV 96.9 78.0 - 100.0 fL   MCH 32.6 26.0 - 34.0 pg   MCHC 33.7 30.0 - 36.0 g/dL   RDW 12.4 11.5 - 15.5 %   Platelets 202 150 - 400 K/uL    Blood Alcohol level:  Lab Results  Component Value Date   ETH <5 84/53/6468    Metabolic Disorder Labs:  No results found for: HGBA1C,  MPG No results found for: PROLACTIN No results found for: CHOL, TRIG, HDL, CHOLHDL, VLDL, LDLCALC  Current Medications: Current Facility-Administered Medications  Medication  Dose Route Frequency Provider Last Rate Last Dose  . acetaminophen (TYLENOL) tablet 650 mg  650 mg Oral Q6H PRN Ethelene Hal, NP   650 mg at 11/19/16 1216  . alum & mag hydroxide-simeth (MAALOX/MYLANTA) 200-200-20 MG/5ML suspension 30 mL  30 mL Oral Q4H PRN Ethelene Hal, NP      . aspirin EC tablet 81 mg  81 mg Oral Daily Ethelene Hal, NP   81 mg at 11/19/16 0810  . carbamazepine (TEGRETOL XR) 12 hr tablet 200 mg  200 mg Oral BID Ethelene Hal, NP   200 mg at 11/18/16 2151  . escitalopram (LEXAPRO) tablet 20 mg  20 mg Oral Daily Ethelene Hal, NP   20 mg at 11/19/16 3545  . feeding supplement (ENSURE ENLIVE) (ENSURE ENLIVE) liquid 237 mL  237 mL Oral BID BM Cobos, Fernando A, MD      . hydrOXYzine (ATARAX/VISTARIL) tablet 25 mg  25 mg Oral Q6H PRN Ethelene Hal, NP   25 mg at 11/18/16 2151  . magnesium hydroxide (MILK OF MAGNESIA) suspension 30 mL  30 mL Oral Daily PRN Ethelene Hal, NP      . multivitamin (PROSIGHT) tablet 1 tablet  1 tablet Oral Daily Ethelene Hal, NP   1 tablet at 11/19/16 6256  . multivitamin with minerals tablet 1 tablet  1 tablet Oral Daily Ethelene Hal, NP   1 tablet at 11/19/16 [phone]  . simvastatin (ZOCOR) tablet 20 mg  20 mg Oral q1800 Ethelene Hal, NP      . traZODone (DESYREL) tablet 50 mg  50 mg Oral QHS PRN Ethelene Hal, NP   50 mg at 11/18/16 2151   PTA Medications: Prescriptions Prior to Admission  Medication Sig Dispense Refill Last Dose  . aspirin EC 81 MG tablet Take 81 mg by mouth daily.   11/18/2016 at Unknown time  . carbamazepine (TEGRETOL) 200 MG tablet   0 11/18/2016 at Unknown time  . escitalopram (LEXAPRO) 20 MG tablet TK 1 T PO QD  0 11/18/2016 at Unknown time  . LORazepam (ATIVAN) 0.5 MG tablet Patient says he took 3 tablets  0 11/18/2016 at Unknown time  . Multiple Vitamin (MULTIVITAMIN) tablet Take 1 tablet by mouth daily.   11/18/2016 at Unknown time  . Multiple  Vitamins-Minerals (EYE VITAMINS PO) Take 1 tablet by mouth daily.   11/17/2016 at Unknown time  . simvastatin (ZOCOR) 20 MG tablet TK 1 T PO Q NIGHT  1 11/18/2016 at Unknown time    Musculoskeletal: Strength & Muscle Tone: within normal limits Gait & Station: normal Patient leans: N/A  Psychiatric Specialty Exam:  Physical Exam  Vitals reviewed. Constitutional: He is oriented to person, place, and time. He appears well-developed.  HENT:  Head: Normocephalic.  Cardiovascular: Normal rate.   Neurological: He is alert and oriented to person, place, and time.  Psychiatric: He has a normal mood and affect. His behavior is normal.    Review of Systems  Psychiatric/Behavioral: Positive for depression and suicidal ideas. The patient is nervous/anxious.     Blood pressure 119/77, pulse 78, temperature 97.8 F (36.6 C), temperature source Oral, resp. rate 18, height 6' 1"  (1.854 m), weight 99.8 kg (220 lb).Body mass index is 29.03 kg/m.  General Appearance: Casual  Eye Contact:  Fair- blank and  flat affect  Speech:  Clear and Coherent  Volume:  Normal  Mood:  Anxious, Depressed and Dysphoric  Affect:  Blunt, Depressed and Flat  Thought Process:  Coherent  Orientation:  Full (Time, Place, and Person)  Thought Content:  Paranoid Ideation and Rumination  Suicidal Thoughts:  Yes.  with intent/plan  Homicidal Thoughts:  No  Memory:  Immediate;   Fair Recent;   Fair Remote;   Fair  Judgement:  Fair  Insight:  Fair  Psychomotor Activity:  Normal  Concentration:  Concentration: Fair  Recall:  [employer] of Knowledge:  Fair  Language:  Good  Akathisia:  No  Handed:  Right  AIMS (if indicated):     Assets:  Communication Skills Desire for Improvement Physical Health Resilience Social Support  ADL's:  Intact  Cognition:  WNL  Sleep:  Number of Hours: 6.75     I agree with current treatment plan on 11/19/2016, Patient seen face-to-face for psychiatric evaluation follow-up.  Reviewed  the information documented and agree with the treatment plan.  Treatment Plan Summary: Daily contact with patient to assess and evaluate symptoms and progress in treatment and Medication management   Continue with Tegretol 200 mg, Lexapro 20 mg  for mood stabilization. Continue with Trazodone 50 mg for insomnia Will continue to monitor vitals ,medication compliance and treatment side effects while patient is here.  Reviewed labs: BAL - , UDS  CSW will start working on disposition.  Patient to participate in therapeutic milieu   Observation Level/Precautions:  15 minute checks  Laboratory:  CBC Chemistry Profile UDS UA  Psychotherapy:  Individual and group session  Medications:  See SRA  Consultations:  Psychiatry  Discharge Concerns:  Safety, stabilization, and risk of access to medication and medication stabilization   Estimated LOS: 5-7days  Other:     Physician Treatment Plan for Primary Diagnosis: <principal problem not specified> Long Term Goal(s): Improvement in symptoms so as ready for discharge  Short Term Goals: Ability to identify changes in lifestyle to reduce recurrence of condition will improve, Ability to verbalize feelings will improve, Ability to disclose and discuss suicidal ideas and Ability to demonstrate self-control will improve  Physician Treatment Plan for Secondary Diagnosis: Active Problems:   Bipolar affective disorder, depressed, severe, with psychotic behavior ([place])  Long Term Goal(s): Improvement in symptoms so as ready for discharge  Short Term Goals: Ability to disclose and discuss suicidal ideas, Ability to demonstrate self-control will improve, Ability to maintain clinical measurements within normal limits will improve and Ability to identify triggers associated with substance abuse/mental health issues will improve  I certify that inpatient services furnished can reasonably be expected to improve the patient's condition.    Derrill Center,  NP 8/6/201812:41 PM

## 2016-11-19 NOTE — Progress Notes (Signed)
D: Pt was in the dayroom upon initial approach.  Pt presents with depressed affect and mood.  Describes his day as "pretty relaxing."  Pt discussed why he is at [facility] with Probation officer.  His goal is to "alleviate sadness."  Pt identifies positive coping skills of "journal, read the bible."  Pt denies SI/HI, reports AH of "funny noises when I'm stressed," reports bilateral elbow pain of 8/10.  Pt has been visible in milieu interacting with peers and staff appropriately.  Pt attended evening group.    A: Introduced self to pt.  Actively listened to pt and offered support and encouragement. Medications offered per order.  PRN medication administered for sleep and pain.  Journal provided to pt.  Q15 minute safety checks maintained.  R: Pt is safe on the unit.  Pt is compliant with medications except for scheduled Lithium, which he refused.  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  Will continue to monitor and assess.

## 2016-11-20 DIAGNOSIS — M2550 Pain in unspecified joint: Secondary | ICD-10-CM

## 2016-11-20 DIAGNOSIS — Z638 Other specified problems related to primary support group: Secondary | ICD-10-CM

## 2016-11-20 DIAGNOSIS — F39 Unspecified mood [affective] disorder: Secondary | ICD-10-CM

## 2016-11-20 DIAGNOSIS — G4700 Insomnia, unspecified: Secondary | ICD-10-CM

## 2016-11-20 DIAGNOSIS — M542 Cervicalgia: Secondary | ICD-10-CM

## 2016-11-20 DIAGNOSIS — Z630 Problems in relationship with spouse or partner: Secondary | ICD-10-CM

## 2016-11-20 DIAGNOSIS — F419 Anxiety disorder, unspecified: Secondary | ICD-10-CM

## 2016-11-20 DIAGNOSIS — Z564 Discord with boss and workmates: Secondary | ICD-10-CM

## 2016-11-20 DIAGNOSIS — F315 Bipolar disorder, current episode depressed, severe, with psychotic features: Principal | ICD-10-CM

## 2016-11-20 MED ORDER — IBUPROFEN 600 MG PO TABS
600.0000 mg | ORAL_TABLET | Freq: Four times a day (QID) | ORAL | Status: DC | PRN
  Administered 2016-11-20 – 2016-11-21 (×2): 600 mg via ORAL
  Filled 2016-11-20 (×2): qty 1

## 2016-11-20 NOTE — Progress Notes (Signed)
Adult Psychoeducational Group Note  Date:  11/20/2016 Time:  11:28 PM  Group Topic/Focus:  Wrap-Up Group:   The focus of this group is to help patients review their daily goal of treatment and discuss progress on daily workbooks.  Participation Level:  Active  Participation Quality:  Appropriate  Affect:  Appropriate  Cognitive:  Alert  Insight: Appropriate  Engagement in Group:  Engaged  Modes of Intervention:  Discussion  Additional Comments:  Pt stated that he enjoyed his day. His goal is to start working towards discharge and getting resources in [place].   Wynelle Fanny R 11/20/2016, 11:28 PM

## 2016-11-20 NOTE — BHH Group Notes (Signed)
[facility] Mental [facility] Association Group Therapy 11/20/2016 1:15pm  Type of Therapy: Mental Health Association Presentation  Participation Level: Active  Participation Quality: Attentive  Affect: Appropriate  Cognitive: Oriented  Insight: Developing/Improving  Engagement in Therapy: Engaged  Modes of Intervention: Discussion, Education and Socialization  Summary of Progress/Problems: Mental Health Association ([place]) Speaker came to talk about his personal journey with living with a mental health diagnosis.The pt processed ways by which to relate to the speaker. [place] speaker provided handouts and educational information pertaining to groups and services offered by the [facility]. Pt was engaged in speaker's presentation and was receptive to resources provided.    Kara Mead. Marshell Levan, MSW, [facility] 11/20/2016 2:43 PM

## 2016-11-20 NOTE — BHH Group Notes (Signed)
[place] Group Notes:  (Nursing/MHT/Case Management/Adjunct)  Date:  11/20/2016  Time:  11:25 AM  Type of Therapy:  Nurse Education  Participation Level:  Active  Participation Quality:  Appropriate  Affect:  Appropriate  Cognitive:  Alert  Insight:  Good  Engagement in Group:  Engaged  Modes of Intervention:  Discussion  Summary of Progress/Problems: Excellent progress and insight.  Debbrah Alar 11/20/2016, 11:25 AM

## 2016-11-20 NOTE — Plan of Care (Signed)
Problem: Self-Concept: Goal: Ability to verbalize positive feelings about self will improve Outcome: Progressing Patient was able to discus his strengths. 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 States he has the ability to communicate well withothers.

## 2016-11-20 NOTE — Progress Notes (Signed)
Patient ID: Calvin Cole, male   DOB: 30-Aug-1955, 61 y.o.   MRN: [id number]  D: Patient denies SI/HI and auditory and visual hallucinations at this time.Affect blunted. Patient discussed events that accumulated to make him want to die. Patient stated that he and his wife had divorced and that his wife had brought her son in court to testify and what the son heard alienated him from [place]. Patient said his boss at work micro managed him and that this took him "over the edge". That was when he went to the hospital.  A: Patient given emotional support from RN. Patient given medications per MD orders. Patient encouraged to attend groups and unit activities. Patient encouraged to come to staff with any questions or concerns.  R: Patient remains cooperative and appropriate. Patient was abl to identify  Work skills and the support he has in the community. Will continue to monitor patient for safety.

## 2016-11-20 NOTE — Progress Notes (Signed)
[facility] MD Progress Note  11/20/2016 3:28 PM Calvin Cole  MRN:  [id number] Subjective:  Patient reports that he is feeling better. He states that he just had his limit with his boss. 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 "She is always on my case and she is a horrible boss." He states that he has a history of anger and aggression and has been to jail because of his anger in the past. He reports self taught training to see a "red stop sign" when he is getting to angry. He has a lot of stress, such as his girlfriend breaking up with him 1 week ago, work stress, the oldest son has refused to talk to him for the last 2.5 years, his youngest son is in college and the patient has $27,000 in parent loans and sends him $700 a month for rent. He reports feeling much much better, especially since he has started back all of his medications. He reported that he was taking half doses because of financial difficulties.  Objective: Patient is talkative about his problems, but is pleasant and friendly. He has agreed to continue medications, but has concern about weight gain. Possible discharge tomorrow and he is ok with that and has an apartment to go back to . He also requested follow up psych with [facility] in [place].   Principal Problem: Bipolar affective disorder, depressed, severe, with psychotic behavior ([place]) Diagnosis:   Patient Active Problem List   Diagnosis Date Noted  . Bipolar affective disorder, depressed, severe, with psychotic behavior ([place]) [F31.5] 11/18/2016   Total Time spent with patient: 25 minutes  Past Psychiatric History: See H&P  Past Medical History:  Past Medical History:  Diagnosis Date  . Bipolar 1 disorder ([place])   . Bulimia nervosa   . High cholesterol   . Hypertension     Past Surgical History:  Procedure Laterality Date  . ABDOMINAL SURGERY    . APPENDECTOMY    . EYE SURGERY    . SHOULDER SURGERY     Family History: History reviewed. No  pertinent family history. Family Psychiatric  History: See H&P Social History:  History  Alcohol Use No    Comment: Pt denied     History  Drug Use No    Comment: Pt denied    Social History   Social History  . Marital status: Single    Spouse name: N/A  . Number of children: N/A  . Years of education: N/A   Social History Main Topics  . Smoking status: Never Smoker  . Smokeless tobacco: Never Used  . Alcohol use No     Comment: Pt denied  . Drug use: No     Comment: Pt denied  . Sexual activity: Not Currently   Other Topics Concern  . None   Social History Narrative  . None   Additional Social History:    Pain Medications: See MAR Prescriptions: See MAR Over the Counter: See MAR History of alcohol / drug use?: No history of alcohol / drug abuse                    Sleep: Good  Appetite:  Good  Current Medications: Current Facility-Administered Medications  Medication Dose Route Frequency Provider Last Rate Last Dose  . acetaminophen (TYLENOL) tablet 650 mg  650 mg Oral Q6H PRN Ethelene Hal, NP   650 mg at 11/20/16 1004  . alum & mag hydroxide-simeth (MAALOX/MYLANTA)  200-200-20 MG/5ML suspension 30 mL  30 mL Oral Q4H PRN Ethelene Hal, NP      . aspirin EC tablet 81 mg  81 mg Oral Daily Ethelene Hal, NP   81 mg at 11/20/16 0805  . carbamazepine (TEGRETOL) tablet 600 mg  600 mg Oral BID Artist Beach, MD   600 mg at 11/20/16 0805  . escitalopram (LEXAPRO) tablet 20 mg  20 mg Oral Daily Ethelene Hal, NP   20 mg at 11/20/16 0805  . feeding supplement (ENSURE ENLIVE) (ENSURE ENLIVE) liquid 237 mL  237 mL Oral BID PRN Adana Marik, Myer Peer, MD      . hydrOXYzine (ATARAX/VISTARIL) tablet 25 mg  25 mg Oral Q6H PRN Ethelene Hal, NP   25 mg at 11/18/16 2151  . lithium carbonate (ESKALITH) CR tablet 450 mg  450 mg Oral QHS Izediuno, Vincent A, MD      . magnesium hydroxide (MILK OF MAGNESIA) suspension 30 mL  30 mL  Oral Daily PRN Ethelene Hal, NP      . multivitamin (PROSIGHT) tablet 1 tablet  1 tablet Oral Daily Ethelene Hal, NP   1 tablet at 11/20/16 [phone]  . multivitamin with minerals tablet 1 tablet  1 tablet Oral Daily Ethelene Hal, NP   1 tablet at 11/20/16 0805  . simvastatin (ZOCOR) tablet 20 mg  20 mg Oral q1800 Ethelene Hal, NP   20 mg at 11/19/16 1950  . traZODone (DESYREL) tablet 50 mg  50 mg Oral QHS PRN Ethelene Hal, NP   50 mg at 11/19/16 2107    Lab Results: No results found for this or any previous visit (from the past 32 hour(s)).  Blood Alcohol level:  Lab Results  Component Value Date   ETH <5 09/98/3382    Metabolic Disorder Labs: No results found for: HGBA1C, MPG No results found for: PROLACTIN No results found for: CHOL, TRIG, HDL, CHOLHDL, VLDL, LDLCALC  Physical Findings: AIMS: Facial and Oral Movements Muscles of Facial Expression: None, normal Lips and Perioral Area: None, normal Jaw: None, normal Tongue: None, normal,Extremity Movements Upper (arms, wrists, hands, fingers): None, normal Lower (legs, knees, ankles, toes): None, normal, Trunk Movements Neck, shoulders, hips: None, normal, Overall Severity Severity of abnormal movements (highest score from questions above): None, normal Incapacitation due to abnormal movements: None, normal Patient's awareness of abnormal movements (rate only patient's report): No Awareness, Dental Status Current problems with teeth and/or dentures?: No Does patient usually wear dentures?: No  CIWA:    COWS:     Musculoskeletal: Strength & Muscle Tone: within normal limits Gait & Station: normal Patient leans: N/A  Psychiatric Specialty Exam: Physical Exam  Nursing note and vitals reviewed. Constitutional: He is oriented to person, place, and time. He appears well-developed and well-nourished.  Cardiovascular: Normal rate.   Respiratory: Effort normal.  GI: Soft.   Musculoskeletal: Normal range of motion.  Neurological: He is alert and oriented to person, place, and time.  Skin: Skin is warm.    Review of Systems  Constitutional: Negative.   HENT: Negative.   Eyes: Negative.   Respiratory: Negative.   Cardiovascular: Negative.   Gastrointestinal: Negative.   Genitourinary: Negative.   Musculoskeletal: Positive for joint pain and neck pain.  Skin: Negative.   Neurological: Negative.   Endo/Heme/Allergies: Negative.     Blood pressure 122/82, pulse 66, temperature 97.7 F (36.5 C), temperature source Oral, resp. rate 16, height 6\' 1"  (1.854 m),  weight 99.8 kg (220 lb).Body mass index is 29.03 kg/m.  General Appearance: Casual  Eye Contact:  Good  Speech:  Clear and Coherent and Normal Rate  Volume:  Normal  Mood:  Euthymic  Affect:  Appropriate  Thought Process:  Coherent and Descriptions of Associations: Intact  Orientation:  Full (Time, Place, and Person)  Thought Content:  WDL  Suicidal Thoughts:  No  Homicidal Thoughts:  No  Memory:  Immediate;   Good Recent;   Good  Judgement:  Good  Insight:  Good  Psychomotor Activity:  Normal  Concentration:  Concentration: Good and Attention Span: Good  Recall:  Good  Fund of Knowledge:  Good  Language:  Good  Akathisia:  No  Handed:  Right  AIMS (if indicated):     Assets:  Financial Resources/Insurance Housing Social Support Transportation  ADL's:  Intact  Cognition:  WNL  Sleep:  Number of Hours: 6.75     Treatment Plan Summary: Daily contact with patient to assess and evaluate symptoms and progress in treatment, Medication management and Plan is to:  -SW staff to assist with psych appointments in [place] notified. -Continue Lexapro 20 mg PO Daily for mood stability -Continue Tegretol 600 mg PO BID for mood stability -Continue Hydroxyzine PRN for anxiety -Continue Lithium 450 mg PO QHS for mood stability -Continue Trazodone PRN for insomnia -Encourage group  therapy participation  Lewis Shock, FNP 11/20/2016, 3:28 PM   Agree with NP Progress Note

## 2016-11-21 DIAGNOSIS — F502 Bulimia nervosa: Secondary | ICD-10-CM

## 2016-11-21 DIAGNOSIS — Z599 Problem related to housing and economic circumstances, unspecified: Secondary | ICD-10-CM

## 2016-11-21 MED ORDER — LITHIUM CARBONATE ER 450 MG PO TBCR: 450.0000 mg | EXTENDED_RELEASE_TABLET | Freq: Every day | ORAL | 0 refills | Status: DC

## 2016-11-21 MED ORDER — HYDROXYZINE HCL 25 MG PO TABS: 25.0000 mg | ORAL_TABLET | Freq: Four times a day (QID) | ORAL | 0 refills | Status: DC | PRN

## 2016-11-21 MED ORDER — TRAZODONE HCL 50 MG PO TABS: 50.0000 mg | ORAL_TABLET | Freq: Every evening | ORAL | 0 refills | Status: DC | PRN

## 2016-11-21 MED ORDER — SIMVASTATIN 20 MG PO TABS: 20.0000 mg | ORAL_TABLET | Freq: Every day | ORAL | 0 refills | Status: DC

## 2016-11-21 MED ORDER — ESCITALOPRAM OXALATE 20 MG PO TABS: 20.0000 mg | ORAL_TABLET | Freq: Every day | ORAL | 0 refills | Status: DC

## 2016-11-21 MED ORDER — CARBAMAZEPINE 200 MG PO TABS: 600.0000 mg | ORAL_TABLET | Freq: Two times a day (BID) | ORAL | 0 refills | Status: DC

## 2016-11-21 MED ORDER — PROSIGHT PO TABS: 1.0000 | ORAL_TABLET | Freq: Every day | ORAL | 0 refills | Status: DC

## 2016-11-21 NOTE — BHH Suicide Risk Assessment (Signed)
[facility] Discharge Suicide Risk Assessment   Principal Problem: Bipolar affective disorder, depressed, severe, with psychotic behavior [facility]) Discharge Diagnoses:  Patient Active Problem List   Diagnosis Date Noted  . Bipolar affective disorder, depressed, severe, with psychotic behavior ([place]) [F31.5] 11/18/2016    Total Time spent with patient: 30 minutes  Musculoskeletal: Strength & Muscle Tone: within normal limits Gait & Station: normal Patient leans: N/A  Psychiatric Specialty Exam: ROS mild headache, no chest pain, no shortness of breath  Blood pressure 113/78, pulse 65, temperature 97.8 F (36.6 C), temperature source Oral, resp. rate 16, height 6\' 1"  (1.854 m), weight 99.8 kg (220 lb).Body mass index is 29.03 kg/m.  General Appearance: Well Groomed  Engineer, water::  Good  Speech:  Normal Rate  Volume:  Normal  Mood:  reports improved mood, describes mood as " good "  Affect:  appropriate, reactive   Thought Process:  Linear and Descriptions of Associations: Intact  Orientation:  Full (Time, Place, and Person)  Thought Content:  denies hallucinations, no delusions, not internally preoccupied   Suicidal Thoughts:  No denies any suicidal or self injurious ideations, denies violent or homicidal ideations  Homicidal Thoughts:  No  Memory:  recent and remote grossly intact   Judgement:  Other:  improved   Insight:  improved   Psychomotor Activity:  Normal  Concentration:  Good  Recall:  Good  Fund of Knowledge:Good  Language: Good  Akathisia:  Negative  Handed:  Right  AIMS (if indicated):     Assets:  Communication Skills Desire for Improvement Resilience  Sleep:  Number of Hours: 6.25  Cognition: WNL  ADL's:  Intact   Mental Status Per Nursing Assessment::   On Admission:     Demographic Factors:  61 year old divorced male, has 2 adult sons, lives alone , employed   Loss Factors: Recent break up , 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 Denies medication side effects  Behavior on unit is in good control. Has been going to groups, pleasant on approach.   Cognitive Features That Contribute To Risk:  No gross cognitive deficits noted upon discharge. Is alert , attentive, and oriented x 3   Suicide Risk:  Mild:  Suicidal ideation of limited frequency, intensity, duration, and specificity.  There are no identifiable plans, no associated intent, mild dysphoria and related symptoms, good self-control (both objective and subjective assessment), few other risk factors, and identifiable protective factors, including available and accessible social support.  Follow-up Information    [facility], [place] on 11/26/2016.   Why:  at 11:40 with Dr. Sherri Rad for medication management.  Contact information: [address] [place] [address] [phone]        [facility]. Go on 12/06/2016.   Specialty:  Behavioral Health Why:  at 11:00AM with Dr. Modesta Messing for intake to begin outpatient therapy services.  Contact information: [address] [place] [phone]  Plan Of Care/Follow-up recommendations:  Activity:  as tolerated  Diet:  regular  Tests:  NA Other:  See below  Expresses readiness for discharge  Plans to return home  Follow up as above .  Has an established PCP  - Dr. Remus Loffler in [place].   Jenne Campus, MD 11/21/2016, 9:46 AM

## 2016-11-21 NOTE — Progress Notes (Signed)
Recreation Therapy Notes  Date: 11/21/2016 Time: 9:30am Location: [address] Day[address]  Group Topic: Stress Management  Goal Area(s) Addresses:  Patient will verbalize importance of using healthy stress management.  Patient will identify positive emotions associated with healthy stress management.   Intervention: Stress Management  Activity : Guided Designer, industrial/product. Recreation Therapy Intern introduced the stress management technique of a guided energy starter. Recreation Therapy Intern read a script that allowed patients to work on stretching and relaxing some muscles to help them feel energized. Recreation Therapy Intern played calming music. Patients were to follow along as script was read to engage in the activity.  Education: Stress Management, Discharge Planning.   Education Outcome: Needs additional edcuation  Clinical Observations/Feedback: Pt did not attend group.  Donovan Kail, Recreation Therapy Intern   Victorino Sparrow, LRT/CTRS

## 2016-11-21 NOTE — Discharge Summary (Signed)
Physician Discharge Summary Note  Patient:  Calvin Cole is an 61 y.o., male MRN:  [id number] DOB:  07/14/1955 Patient phone:  [phone] (home)  Patient address:   [address] [place] [address],  Total Time spent with patient: 30 minutes  Date of Admission:  11/18/2016 Date of Discharge: 11/21/2016  Reason for Admission: SI, AH  Principal Problem: Bipolar affective disorder, depressed, severe, with psychotic behavior [facility]) Discharge Diagnoses: Patient Active Problem List   Diagnosis Date Noted  . Bipolar affective disorder, depressed, severe, with psychotic behavior ([place]) [F31.5] 11/18/2016    Past Psychiatric History: depression, bipolar affective disorder. Prescribed Lamictal, Ativan, and Tegretol for treatment (psychiatrist is with the [facility] in [place], [place]).  Past Medical History:  Past Medical History:  Diagnosis Date  . Bipolar 1 disorder ([place])   . Bulimia nervosa   . High cholesterol   . Hypertension     Past Surgical History:  Procedure Laterality Date  . ABDOMINAL SURGERY    . APPENDECTOMY    . EYE SURGERY    . SHOULDER SURGERY     Family History: History reviewed. No pertinent family history. Family Psychiatric  History: None noted in chart Social History:  History  Alcohol Use No    Comment: Pt denied     History  Drug Use No    Comment: Pt denied    Social History   Social History  . Marital status: Single    Spouse name: N/A  . Number of children: N/A  . Years of education: N/A   Social History Main Topics  . Smoking status: Never Smoker  . Smokeless tobacco: Never Used  . Alcohol use No     Comment: Pt denied  . Drug use: No     Comment: Pt denied  . Sexual activity: Not Currently   Other Topics Concern  . None   Social History Narrative  . None    Hospital Course:  Per assessment note-Calvin XXXJohnsonis a 61 y.o.malewho presented to [facility] on a voluntary basis with complaint of suicidal ideation  with plan and other depressive symptoms. Pt provided history. He stated that he has a history of Bipolar I Disorder and is prescribed Lamictal, Ativan, and Tegretol for treatment (psychiatrist is with the [facility] in [place], [place]). Pt said he is compliant with medication. Pt stated that although he takes his medication, for the last week he has experienced significant depressive symptoms. Pt endorsed the following: Suicidal ideation with plan to overdose; persistent and unremitting despondency, insomnia (about 3 hours per night), mixed appetite, feelings of worthlessness and hopelessness, fatigue, isolation. Pt also endorsed auditory hallucination -- voices telling him that he is worthless. In addition to these symptoms, Pt endorsed ongoing bulimia.  Pt identified the following as triggers for his suicidal state: Recent break-up with girlfriend, declining physical health (nerve issues around elbows), and work/financial stressors. Pt now lives alone. He works as a Biomedical scientist. Social supports are limited -- he has one brother in the area and tends to isolate.  During assessment, Pt presented as alert and oriented. He had good eye contact and was cooperative. Pt was dressed in scrubs, and he appeared appropriately groomed. Pt's mood and affect were depressed. Pt endorsed suicidal ideation, auditory hallucination, and other depressive symptoms. Pt denied homicidal ideation, visual hallucination, current self-injurious behavior (cut one time several years ago), and substance use concerns. Pt's speech was normal in rate, rhythm, and volume. Thought processes were within normal range,  and thought content was logical and goal-oriented. There was no evidence of delusion. Memory and concentration were fair. Ipulse control and insight were fair. Judgment was poor.  Discharge Evaluation: After the above admission assessment and during this hospital course, patients presenting  symptoms were identified. Patient was treated and discharged with the following medications; Lexapro 20 mg PO Daily for mood stability, Tegretol 600 mg PO BID for mood stability, Hydroxyzine PRN for anxiety,  Lithium 450 mg PO QHS for mood stability, Trazodone PRN for insomnia. He was to resume home medications as noted below. Prior to discharge, while on the unit, patient was able to verbalize learned coping skills for better management of depression and suicidal thoughts before his discharge home.   During the course of his hospitalization, improvement was monitored by observation and Calvin Cole's daily  report of symptom reduction, presentation of good affect,and overall improvement in mood & behavior. Patient tolerated his treatment regimen without any adverse effects reported.  Calvin Cole's case was presented during treatment team meeting this morning. The team members were all in agreement that Calvin Cole was both mentally & medically stable to be discharged to continue mental health care on an outpatient basis as noted below. He was provided with all the necessary information needed to make this appointment without problems.  Upon discharge, Calvin Cole denied any SI/HI, AVH, delusional thoughts, or paranoia. He was provided with prescriptions  of her [facility] discharge medications to be taken to his pharmacy. He left [facility] with all personal belongings in no apparent distress. Transportation per patients arrangement.  Physical Findings: AIMS: Facial and Oral Movements Muscles of Facial Expression: None, normal Lips and Perioral Area: None, normal Jaw: None, normal Tongue: None, normal,Extremity Movements Upper (arms, wrists, hands, fingers): None, normal Lower (legs, knees, ankles, toes): None, normal, Trunk Movements Neck, shoulders, hips: None, normal, Overall Severity Severity of abnormal movements (highest score from questions above): None, normal Incapacitation due to abnormal movements: None, normal Patient's  awareness of abnormal movements (rate only patient's report): No Awareness, Dental Status Current problems with teeth and/or dentures?: No Does patient usually wear dentures?: No  CIWA:    COWS:     Musculoskeletal: Strength & Muscle Tone: within normal limits Gait & Station: normal Patient leans: N/A  Psychiatric Specialty Exam: SEE SRA BY MD  Physical Exam  Nursing note and vitals reviewed. Constitutional: He is oriented to person, place, and time.  Neurological: He is alert and oriented to person, place, and time.    Review of Systems  Psychiatric/Behavioral: Negative for suicidal ideas. Depression: improved.    Blood pressure 113/78, pulse 65, temperature 97.8 F (36.6 C), temperature source Oral, resp. rate 16, height 6\' 1"  (1.854 m), weight 220 lb (99.8 kg).Body mass index is 29.03 kg/m.    Have you used any form of tobacco in the last 30 days? (Cigarettes, Smokeless Tobacco, Cigars, and/or Pipes): No  Has this patient used any form of tobacco in the last 30 days? (Cigarettes, Smokeless Tobacco, Cigars, and/or Pipes)  N/A  Blood Alcohol level:  Lab Results  Component Value Date   ETH <5 81/82/9937    Metabolic Disorder Labs:  No results found for: HGBA1C, MPG No results found for: PROLACTIN No results found for: CHOL, TRIG, HDL, CHOLHDL, VLDL, LDLCALC  See Psychiatric Specialty Exam and Suicide Risk Assessment completed by Attending Physician prior to discharge.  Discharge destination:  Home  Is patient on multiple antipsychotic therapies at discharge:  No   Has Patient had three or more failed  trials of antipsychotic monotherapy by history:  No  Recommended Plan for Multiple Antipsychotic Therapies: NA   Allergies as of 11/21/2016      Reactions   Sulfa Antibiotics Other (See Comments)   fever      Medication List    STOP taking these medications   LORazepam 0.5 MG tablet Commonly known as:  ATIVAN     TAKE these medications     Indication   aspirin EC 81 MG tablet Take 81 mg by mouth daily.  Indication:  prophalaxisis HA/pain   carbamazepine 200 MG tablet Commonly known as:  TEGRETOL Take 3 tablets (600 mg total) by mouth 2 (two) times daily. What changed:  how much to take  how to take this  when to take this  Indication:  mood stabilization   escitalopram 20 MG tablet Commonly known as:  LEXAPRO Take 1 tablet (20 mg total) by mouth daily. What changed:  See the new instructions.  Indication:  Major Depressive Disorder   EYE VITAMINS PO Take 1 tablet by mouth daily. What changed:  Another medication with the same name was added. Make sure you understand how and when to take each.  Indication:  vitamin supplement   multivitamin Tabs tablet Take 1 tablet by mouth daily. Patient should resume this as a home medication. No prescription provided at discharge. What changed:  You were already taking a medication with the same name, and this prescription was added. Make sure you understand how and when to take each.  Indication:  mineral/viatamin supplement   hydrOXYzine 25 MG tablet Commonly known as:  ATARAX/VISTARIL Take 1 tablet (25 mg total) by mouth every 6 (six) hours as needed for anxiety.  Indication:  Feeling Anxious   lithium carbonate 450 MG CR tablet Commonly known as:  ESKALITH Take 1 tablet (450 mg total) by mouth at bedtime.  Indication:  mood stabilization   multivitamin tablet Take 1 tablet by mouth daily.  Indication:  vitamin supplement   simvastatin 20 MG tablet Commonly known as:  ZOCOR Take 1 tablet (20 mg total) by mouth daily at 6 PM. What changed:  See the new instructions.  Indication:  High Amount of Fats in the Blood   traZODone 50 MG tablet Commonly known as:  DESYREL Take 1 tablet (50 mg total) by mouth at bedtime as needed for sleep.  Indication:  [place], [place] on 11/26/2016.   Why:  at 11:40 with Dr.  Sherri Rad for medication management.  Contact information: [address] [place]  [address] [phone]        [facility]. Go on 12/06/2016.   Specialty:  Behavioral Health Why:  at 11:00AM with Dr. Modesta Messing for intake to begin outpatient therapy services.  Contact information: [address] [place] [phone]          Follow-up recommendations:  Follow up with your outpatient provided for any medical issues. Activity & diet as recommended by your primary care provider.  Comments:  Patient is instructed prior to discharge to: Take all medications as prescribed by his/her mental healthcare provider. Report any adverse effects and or reactions from the medicines to his/her outpatient provider promptly. Patient has been instructed & cautioned: To not engage in alcohol and or illegal drug use while on prescription medicines. In the event of worsening symptoms, patient is instructed to call the crisis hotline,  911 and or go to the nearest ED for appropriate evaluation and treatment of symptoms. To follow-up with his/her primary care provider for your other medical issues, concerns and or health care needs.  Signed: Mordecai Maes, NP 11/21/2016, 10:42 AM   Patient seen, Suicide Assessment Completed.  Disposition Plan Reviewed

## 2016-11-21 NOTE — Plan of Care (Signed)
Problem: Coping: Goal: Ability to demonstrate self-control will improve Outcome: Progressing Pt has been in control of his behavior tonight.

## 2016-11-21 NOTE — Progress Notes (Signed)
D: Pt was in the dayroom upon initial approach.  Pt presents with depressed affect and mood.  Describes his day as "great" and reports he started "working on my discharge plan."  Pt reports he is trying to discharge tomorrow and he feels safe to do so.  Pt denies SI/HI, denies hallucinations, reports bilateral elbow pain of 7/10.  Pt has been visible in milieu interacting with peers and staff appropriately.  Pt attended evening group.    A:  Actively listened to pt and offered support and encouragement. Medications offered per order.  PRN medication administered for pain and sleep.  Q15 minute safety checks maintained.  R: Pt is safe on the unit.  Pt is compliant with medications except for Lithium, which he continues to refuse.  Pt verbally contracts for safety.  Will continue to monitor and assess.

## 2016-11-21 NOTE — Progress Notes (Signed)
Patient discharged home. DC instructions provided and explained. Medications removed. Rx given. All questions answered. Denies SI, HI, AVH. Belongings returned. AVS, transition and risk assessment given to patient. Pt stable at discharge. Left with [facility] transportation.

## 2016-11-21 NOTE — Progress Notes (Signed)
  [facility] Adult Case Management Discharge Plan :  Will you be returning to the same living situation after discharge:  Yes,  pt returning home. At discharge, do you have transportation home?: Yes,  pt has access to transportation. Do you have the ability to pay for your medications: Yes,  pt has insurance.  Release of information consent forms completed and in the chart;  Patient's signature needed at discharge.  Patient to Follow up at: Follow-up Information    [facility], [place] on 11/26/2016.   Why:  at 11:40 with Dr. Sherri Rad for medication management.  Contact information: [address] [place] [address] [phone]        [facility]. Go on 12/06/2016.   Specialty:  Behavioral Health Why:  at 11:00AM with Dr. Modesta Messing for intake to begin outpatient therapy services.  Contact information: [address] [place] [phone]          Next level of care provider has access to [place] and Suicide Prevention discussed: Yes,  with pt.  Have you used any form of tobacco in the last 30 days? (Cigarettes, Smokeless Tobacco, Cigars, and/or Pipes): No  Has patient been referred to the Quitline?: N/A patient is not a smoker  Patient has been referred for addiction treatment: Yes  [name], MSW, LCSWA 11/21/2016, 9:34 AM

## 2016-11-21 NOTE — BHH Suicide Risk Assessment (Signed)
[place] INPATIENT:  Family/Significant Other Suicide Prevention Education  Suicide Prevention Education:  Contact Attempts: Janai Brannigan (brother [phone]), has been identified by the patient as the family member/significant other with whom the patient will be residing, and identified as the person(s) who will aid the patient in the event of a mental health crisis.  With written consent from the patient, two attempts were made to provide suicide prevention education, prior to and/or following the patient's discharge.  We were unsuccessful in providing suicide prevention education.  A suicide education pamphlet was given to the patient to share with family/significant other.  Date and time of second attempt: 11/21/16 @ 9:30am. No answer, CSW left a message.  [name], MSW, LCSWA 11/21/2016, 9:31 AM

## 2016-11-21 NOTE — BHH Suicide Risk Assessment (Signed)
[name] INPATIENT:  Family/Significant Other Suicide Prevention Education  Suicide Prevention Education:  Contact Attempts: Calvin Cole (brother [phone]), has been identified by the patient as the family member/significant other with whom the patient will be residing, and identified as the person(s) who will aid the patient in the event of a mental health crisis.  With written consent from the patient, two attempts were made to provide suicide prevention education, prior to and/or following the patient's discharge.  We were unsuccessful in providing suicide prevention education.  A suicide education pamphlet was given to the patient to share with family/significant other.  Date and time of first attempt: 11/21/16 @ 9:00am. No answer, CSW left a message.   [name], MSW, LCSWA  11/21/2016, 9:02 AM
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
## 2016-12-06 ENCOUNTER — Encounter (HOSPITAL_COMMUNITY): Payer: Self-pay | Admitting: Psychiatry

## 2016-12-06 ENCOUNTER — Ambulatory Visit (INDEPENDENT_AMBULATORY_CARE_PROVIDER_SITE_OTHER): Payer: Managed Care, Other (non HMO) | Admitting: Psychiatry

## 2016-12-06 VITALS — BP 137/82 | HR 67 | Ht 73.0 in | Wt 231.0 lb

## 2016-12-06 DIAGNOSIS — Z79899 Other long term (current) drug therapy: Secondary | ICD-10-CM | POA: Diagnosis not present

## 2016-12-06 DIAGNOSIS — F1021 Alcohol dependence, in remission: Secondary | ICD-10-CM | POA: Diagnosis not present

## 2016-12-06 DIAGNOSIS — F502 Bulimia nervosa: Secondary | ICD-10-CM | POA: Diagnosis not present

## 2016-12-06 DIAGNOSIS — F3130 Bipolar disorder, current episode depressed, mild or moderate severity, unspecified: Secondary | ICD-10-CM | POA: Insufficient documentation

## 2016-12-06 DIAGNOSIS — I10 Essential (primary) hypertension: Secondary | ICD-10-CM | POA: Diagnosis not present

## 2016-12-06 DIAGNOSIS — F319 Bipolar disorder, unspecified: Secondary | ICD-10-CM | POA: Insufficient documentation

## 2016-12-06 DIAGNOSIS — G479 Sleep disorder, unspecified: Secondary | ICD-10-CM

## 2016-12-06 MED ORDER — CARIPRAZINE HCL 1.5 MG PO CAPS: 1.5000 mg | ORAL_CAPSULE | Freq: Every day | ORAL | 1 refills | Status: DC

## 2016-12-06 NOTE — Patient Instructions (Signed)
1. Continue cabaramapine 600 mg twice a day 2. Continue lexapro 20 mg daily 3. Continue lithium 450 mg daily 4. Continue Trazodone 50 mg at night as needed for sleep 5. Continue ativan 0.5 mg daily as needed for anxiety 6. Start vraylar 1.5 mg daily 7. Return to clinic in one month for 30 mins 8. Referral to therapy 9. Obtain record from prior psychiatrist 10. Obtain blood test

## 2016-12-14 ENCOUNTER — Telehealth (HOSPITAL_COMMUNITY): Payer: Self-pay | Admitting: *Deleted

## 2016-12-14 NOTE — Telephone Encounter (Signed)
Per Dr Modesta Messing to see if Dr. Harrington Challenger could please check pt results for his Carbamaepine, Lithium, TSH, CBC and CMP. Per Dr. Modesta Messing, she would like to make sure you or her do not need to adjust pt medications unless theres some toxicity issues. Per pt chart, pt have not gotten his labs completed. Called pt and asked him if he got his labs done and he stated he have not. Per pt, he's been really busy work work and just have not gotten around to going to the lab. Per pt he will go on 12-18-2016 to get them done.

## 2016-12-18 NOTE — Telephone Encounter (Signed)
noted 

## 2016-12-18 NOTE — Telephone Encounter (Signed)
ok 

## 2017-01-01 ENCOUNTER — Ambulatory Visit (INDEPENDENT_AMBULATORY_CARE_PROVIDER_SITE_OTHER): Payer: PRIVATE HEALTH INSURANCE | Admitting: Licensed Clinical Social Worker

## 2017-01-01 DIAGNOSIS — F502 Bulimia nervosa: Secondary | ICD-10-CM

## 2017-01-01 DIAGNOSIS — F319 Bipolar disorder, unspecified: Secondary | ICD-10-CM | POA: Diagnosis not present
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
## 2017-01-10 ENCOUNTER — Ambulatory Visit (HOSPITAL_COMMUNITY): Payer: Self-pay | Admitting: Psychiatry
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
## 2017-01-17 ENCOUNTER — Ambulatory Visit (INDEPENDENT_AMBULATORY_CARE_PROVIDER_SITE_OTHER): Payer: PRIVATE HEALTH INSURANCE | Admitting: Licensed Clinical Social Worker

## 2017-01-17 DIAGNOSIS — F502 Bulimia nervosa: Secondary | ICD-10-CM

## 2017-01-17 DIAGNOSIS — F3130 Bipolar disorder, current episode depressed, mild or moderate severity, unspecified: Secondary | ICD-10-CM | POA: Diagnosis not present

## 2017-01-17 DIAGNOSIS — F319 Bipolar disorder, unspecified: Secondary | ICD-10-CM

## 2017-01-17 NOTE — Progress Notes (Signed)
   THERAPIST PROGRESS NOTE  Session Time: 10:00 am-10:50 am  Participation Level: Active  Behavioral Response: CasualAlertEuthymic  Type of Therapy: Individual Therapy  Treatment Goals addressed: Coping  Interventions: CBT and Solution Focused  Summary: Calvin Cole is a 62 y.o. male who presents  oriented x5 (person, place, situation, time and object), alert, average height, average weight but feels overweight, tearful at times, dressed for work, appropriately groomed and cooperative to address mood. Patient has a minimal medical treatment history and has a history of mental health treatment that includes outpatient therapy, medication management and hospitalization. Patient admits to symptoms of mania in the past including agitation, increased sexual desire/activity, euphoria, and impulsivity. Patient denies suicidal and homicidal thoughts. He had a recent hospitalization for SI but had no intent to harm self, he wanted to calm down and get help. Patient denies psychosis including auditory and visual hallucinations but admits to ringing in his ears that can be confusing to him. Patient denies substance abuse. He is at low risk for lethality at this time.   Patient had an average score of 4 out of 10 on the Outcome Rating Scale. Patient reported that his recent relationship ended. He reported that she felt like things are going too fast and wasn't ready. Patient wants to be in a relationship, feel loved and wants someone to really care about him. After discussion, patient understood that he can take time for himself and explore the area he lives in, write and let go of his past relationships. Patient committed to take time for himself by exploring the area, write and let go of his past. Patient rated the session 8.5 out of 10 on the Session Rating Scale.   Patient engaged in session. Patient responded well to interventions. Patient continues to meet criteria for Bipolar 1 disorder and Bulimia  nervosa. Patient will continue in outpatient therapy due to being the least restrictive service to meet his needs. Patient made no progress on his goal.  Suicidal/Homicidal: Negativewithout intent/plan  Therapist Response: Therapist reviewed patient's recent thoughts and behaviors. Therapist utilized CBT to address mood. Therapist processed patient's feelings to identify triggers for mood. Therapist assisted patient in identifying ways to accept where he is at life and be ok with being "alone." Therapist committed patient to accept where he is at in life and enjoy this season of being alone. Therapist administered the Outcome Rating Session and Session Rating Scale.    Plan: Return again in 3 weeks. Therapist will review patient goals on or before 12.18.2018  Diagnosis: Axis I: Bipolar, Depressed and Bulimia nervosa    Axis II: No diagnosis    Glori Bickers, LCSW 01/17/2017

## 2017-01-18 LAB — CBC
HCT: 44.9 % (ref 38.5–50.0)
Hemoglobin: 15.3 g/dL (ref 13.2–17.1)
MCH: 32.6 pg (ref 27.0–33.0)
MCHC: 34.1 g/dL (ref 32.0–36.0)
MCV: 95.7 fL (ref 80.0–100.0)
MPV: 10.6 fL (ref 7.5–12.5)
Platelets: 224 10*3/uL (ref 140–400)
RBC: 4.69 10*6/uL (ref 4.20–5.80)
RDW: 12 % (ref 11.0–15.0)
WBC: 6 10*3/uL (ref 3.8–10.8)

## 2017-01-18 LAB — COMPLETE METABOLIC PANEL WITH GFR
AG Ratio: 1.7 (calc) (ref 1.0–2.5)
ALT: 19 U/L (ref 9–46)
AST: 16 U/L (ref 10–35)
Albumin: 4.3 g/dL (ref 3.6–5.1)
Alkaline phosphatase (APISO): 47 U/L (ref 40–115)
BUN: 15 mg/dL (ref 7–25)
CO2: 27 mmol/L (ref 20–32)
Calcium: 9.3 mg/dL (ref 8.6–10.3)
Chloride: 101 mmol/L (ref 98–110)
Creat: 0.95 mg/dL (ref 0.70–1.33)
GFR, Est African American: 102 mL/min/{1.73_m2} (ref >=60)
GFR, Est Non African American: 88 mL/min/{1.73_m2} (ref >=60)
Globulin: 2.5 g/dL (calc) (ref 1.9–3.7)
Glucose, Bld: 84 mg/dL (ref 65–99)
Potassium: 4.7 mmol/L (ref 3.5–5.3)
Sodium: 138 mmol/L (ref 135–146)
Total Bilirubin: 0.3 mg/dL (ref 0.2–1.2)
Total Protein: 6.8 g/dL (ref 6.1–8.1)

## 2017-01-18 LAB — CARBAMAZEPINE LEVEL, TOTAL: Carbamazepine Lvl: 11.8 mg/L (ref 4.0–12.0)

## 2017-01-18 LAB — LITHIUM LEVEL: Lithium Lvl: 0.4 mmol/L — ABNORMAL LOW (ref 0.6–1.2)

## 2017-01-18 LAB — TSH: TSH: 4.42 mIU/L (ref 0.40–4.50)

## 2017-01-21 ENCOUNTER — Ambulatory Visit (INDEPENDENT_AMBULATORY_CARE_PROVIDER_SITE_OTHER): Payer: PRIVATE HEALTH INSURANCE | Admitting: Psychiatry

## 2017-01-21 ENCOUNTER — Encounter (HOSPITAL_COMMUNITY): Payer: Self-pay | Admitting: Psychiatry

## 2017-01-21 VITALS — BP 125/96 | HR 64 | Ht 73.0 in | Wt 237.0 lb

## 2017-01-21 DIAGNOSIS — F319 Bipolar disorder, unspecified: Secondary | ICD-10-CM

## 2017-01-21 DIAGNOSIS — F1021 Alcohol dependence, in remission: Secondary | ICD-10-CM

## 2017-01-21 DIAGNOSIS — F419 Anxiety disorder, unspecified: Secondary | ICD-10-CM | POA: Diagnosis not present

## 2017-01-21 DIAGNOSIS — G4700 Insomnia, unspecified: Secondary | ICD-10-CM | POA: Diagnosis not present

## 2017-01-21 DIAGNOSIS — Z79899 Other long term (current) drug therapy: Secondary | ICD-10-CM | POA: Diagnosis not present

## 2017-01-21 DIAGNOSIS — F502 Bulimia nervosa: Secondary | ICD-10-CM

## 2017-01-21 DIAGNOSIS — I10 Essential (primary) hypertension: Secondary | ICD-10-CM | POA: Diagnosis not present

## 2017-01-21 MED ORDER — LITHIUM CARBONATE ER 450 MG PO TBCR: 450.0000 mg | EXTENDED_RELEASE_TABLET | Freq: Every day | ORAL | 1 refills | Status: DC

## 2017-01-21 NOTE — Patient Instructions (Signed)
1. Continue carbamazepine 600 mg twice a day  2. Continue lithium 450 mg daily 3. Continue Trazodone 50 mg at night as needed for sleep 4. Continue lexapro 20 mg daily 5. Continue ativan 0.5 mg mg day as needed for anxiety  6. Return to clinic in one month for 30 mins

## 2017-02-07 ENCOUNTER — Ambulatory Visit (INDEPENDENT_AMBULATORY_CARE_PROVIDER_SITE_OTHER): Payer: PRIVATE HEALTH INSURANCE | Admitting: Licensed Clinical Social Worker

## 2017-02-07 DIAGNOSIS — Z598 Other problems related to housing and economic circumstances: Secondary | ICD-10-CM | POA: Diagnosis not present

## 2017-02-07 DIAGNOSIS — F3130 Bipolar disorder, current episode depressed, mild or moderate severity, unspecified: Secondary | ICD-10-CM

## 2017-02-07 DIAGNOSIS — F319 Bipolar disorder, unspecified: Secondary | ICD-10-CM

## 2017-02-07 DIAGNOSIS — F502 Bulimia nervosa: Secondary | ICD-10-CM | POA: Diagnosis not present

## 2017-02-07 NOTE — Progress Notes (Signed)
THERAPIST PROGRESS NOTE  Session Time: 10:00 am-10:50 am  Participation Level: Active  Behavioral Response: CasualAlertEuthymic  Type of Therapy: Individual Therapy  Treatment Goals addressed: Coping  Interventions: CBT and Solution Focused  Summary: Calvin Cole is a 61 y.o. male who presents  oriented x5 (person, place, situation, time and object), alert, average height, average weight but feels overweight, tearful at times, dressed for work, appropriately groomed and cooperative to address mood. Patient has a minimal medical treatment history and has a history of mental health treatment that includes outpatient therapy, medication management and hospitalization. Patient admits to symptoms of mania in the past including agitation, increased sexual desire/activity, euphoria, and impulsivity. Patient denies suicidal and homicidal thoughts. He had a recent hospitalization for SI but had no intent to harm self, he wanted to calm down and get help. Patient denies psychosis including auditory and visual hallucinations but admits to ringing in his ears that can be confusing to him. Patient denies substance abuse. He is at low risk for lethality at this time.   Patient had an average score of 3.75 out of 10 on the Outcome Rating Scale which is a decrease of .25 since the last session. Patient explained that he is having trouble getting his short term disability from his work. He noted that he has called several people and sent emails and the only thing that is holding up the process is that the disability insurance needs to know how much paid time off the patient has used. Patient is frustrated because it should only take a moment to provide this information but his work is not doing it. Patient noted that if he doesn't get the check he won't be able to pay any bills and have absolutely no money by the end of the week. Patient noted that he has been feeling lonely and has been trying to combat that by  getting out in the community as well as going to [employer]. Patient noted that he wants to write a book and feels like he has several books in his head. Patient also noted that he has a male that has been reaching out to him from a dating website and they have been communicating. Patient is happy about the potential relationship. Patient noted that if he can continue to talk to this male and get his short term disability situation resolved that he would feel better. Patient committed to manage his mood by taking small steps to feel better (work on short term disability, take things slow with male). Patient rated the session 8.75 out of 10 on the Session Rating Scale.   Patient engaged in session. Patient responded well to interventions. Patient continues to meet criteria for Bipolar 1 disorder and Bulimia nervosa. Patient will continue in outpatient therapy due to being the least restrictive service to meet his needs. Patient made minimal progress on his goal.  Suicidal/Homicidal: Negativewithout intent/plan  Therapist Response: Therapist reviewed patient's recent thoughts and behaviors. Therapist utilized CBT to address mood. Therapist processed patient's feelings to identify triggers for mood. Therapist assisted patient in identifying ways to improve his mood.  manage his mood by taking small steps to feel better (work on short term disability, take things slow with male).  Therapist administered the Outcome Rating Session and Session Rating Scale.    Plan: Return again in 3 weeks. Therapist will review patient goals on or before 12.18.2018  Diagnosis: Axis I: Bipolar, Depressed and Bulimia nervosa    Axis II: No diagnosis  Glori Bickers, LCSW 02/07/2017

## 2017-02-14 ENCOUNTER — Encounter: Payer: Self-pay | Admitting: Family Medicine

## 2017-02-14 ENCOUNTER — Ambulatory Visit (INDEPENDENT_AMBULATORY_CARE_PROVIDER_SITE_OTHER): Payer: BLUE CROSS/BLUE SHIELD | Admitting: Family Medicine

## 2017-02-14 VITALS — BP 152/84 | HR 79 | Temp 97.1°F | Resp 16 | Ht 73.0 in | Wt 243.2 lb

## 2017-02-14 DIAGNOSIS — I10 Essential (primary) hypertension: Secondary | ICD-10-CM | POA: Diagnosis not present

## 2017-02-14 DIAGNOSIS — Z113 Encounter for screening for infections with a predominantly sexual mode of transmission: Secondary | ICD-10-CM

## 2017-02-14 DIAGNOSIS — E782 Mixed hyperlipidemia: Secondary | ICD-10-CM | POA: Diagnosis not present

## 2017-02-14 DIAGNOSIS — Z23 Encounter for immunization: Secondary | ICD-10-CM | POA: Diagnosis not present

## 2017-02-14 DIAGNOSIS — R946 Abnormal results of thyroid function studies: Secondary | ICD-10-CM | POA: Diagnosis not present

## 2017-02-14 MED ORDER — AMLODIPINE BESYLATE 5 MG PO TABS: 5.0000 mg | ORAL_TABLET | Freq: Every day | ORAL | 0 refills | Status: DC
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
## 2017-02-14 NOTE — Patient Instructions (Signed)
DASH Eating Plan DASH stands for "Dietary Approaches to Stop Hypertension." The DASH eating plan is a healthy eating plan that has been shown to reduce high blood pressure (hypertension). It may also reduce your risk for type 2 diabetes, heart disease, and stroke. The DASH eating plan may also help with weight loss. What are tips for following this plan? General guidelines  Avoid eating more than 2,300 mg (milligrams) of salt (sodium) a day. If you have hypertension, you may need to reduce your sodium intake to 1,500 mg a day.  Limit alcohol intake to no more than 1 drink a day for nonpregnant women and 2 drinks a day for men. One drink equals 12 oz of beer, 5 oz of wine, or 1 oz of hard liquor.  Work with your health care provider to maintain a healthy body weight or to lose weight. Ask what an ideal weight is for you.  Get at least 30 minutes of exercise that causes your heart to beat faster (aerobic exercise) most days of the week. Activities may include walking, swimming, or biking.  Work with your health care provider or diet and nutrition specialist (dietitian) to adjust your eating plan to your individual calorie needs. Reading food labels  Check food labels for the amount of sodium per serving. Choose foods with less than 5 percent of the Daily Value of sodium. Generally, foods with less than 300 mg of sodium per serving fit into this eating plan.  To find whole grains, look for the word "whole" as the first word in the ingredient list. Shopping  Buy products labeled as "low-sodium" or "no salt added."  Buy fresh foods. Avoid canned foods and premade or frozen meals. Cooking  Avoid adding salt when cooking. Use salt-free seasonings or herbs instead of table salt or sea salt. Check with your health care provider or pharmacist before using salt substitutes.  Do not fry foods. Cook foods using healthy methods such as baking, boiling, grilling, and broiling instead.  Cook with  heart-healthy oils, such as olive, canola, soybean, or sunflower oil. Meal planning   Eat a balanced diet that includes: ? 5 or more servings of fruits and vegetables each day. At each meal, try to fill half of your plate with fruits and vegetables. ? Up to 6-8 servings of whole grains each day. ? Less than 6 oz of lean meat, poultry, or fish each day. A 3-oz serving of meat is about the same size as a deck of cards. One egg equals 1 oz. ? 2 servings of low-fat dairy each day. ? A serving of nuts, seeds, or beans 5 times each week. ? Heart-healthy fats. Healthy fats called Omega-3 fatty acids are found in foods such as flaxseeds and coldwater fish, like sardines, salmon, and mackerel.  Limit how much you eat of the following: ? Canned or prepackaged foods. ? Food that is high in trans fat, such as fried foods. ? Food that is high in saturated fat, such as fatty meat. ? Sweets, desserts, sugary drinks, and other foods with added sugar. ? Full-fat dairy products.  Do not salt foods before eating.  Try to eat at least 2 vegetarian meals each week.  Eat more home-cooked food and less restaurant, buffet, and fast food.  When eating at a restaurant, ask that your food be prepared with less salt or no salt, if possible. What foods are recommended? The items listed may not be a complete list. Talk with your dietitian about what   dietary choices are best for you. Grains Whole-grain or whole-wheat bread. Whole-grain or whole-wheat pasta. Brown rice. Oatmeal. Quinoa. Bulgur. Whole-grain and low-sodium cereals. Pita bread. Low-fat, low-sodium crackers. Whole-wheat flour tortillas. Vegetables Fresh or frozen vegetables (raw, steamed, roasted, or grilled). Low-sodium or reduced-sodium tomato and vegetable juice. Low-sodium or reduced-sodium tomato sauce and tomato paste. Low-sodium or reduced-sodium canned vegetables. Fruits All fresh, dried, or frozen fruit. Canned fruit in natural juice (without  added sugar). Meat and other protein foods Skinless chicken or turkey. Ground chicken or turkey. Pork with fat trimmed off. Fish and seafood. Egg whites. Dried beans, peas, or lentils. Unsalted nuts, nut butters, and seeds. Unsalted canned beans. Lean cuts of beef with fat trimmed off. Low-sodium, lean deli meat. Dairy Low-fat (1%) or fat-free (skim) milk. Fat-free, low-fat, or reduced-fat cheeses. Nonfat, low-sodium ricotta or cottage cheese. Low-fat or nonfat yogurt. Low-fat, low-sodium cheese. Fats and oils Soft margarine without trans fats. Vegetable oil. Low-fat, reduced-fat, or light mayonnaise and salad dressings (reduced-sodium). Canola, safflower, olive, soybean, and sunflower oils. Avocado. Seasoning and other foods Herbs. Spices. Seasoning mixes without salt. Unsalted popcorn and pretzels. Fat-free sweets. What foods are not recommended? The items listed may not be a complete list. Talk with your dietitian about what dietary choices are best for you. Grains Baked goods made with fat, such as croissants, muffins, or some breads. Dry pasta or rice meal packs. Vegetables Creamed or fried vegetables. Vegetables in a cheese sauce. Regular canned vegetables (not low-sodium or reduced-sodium). Regular canned tomato sauce and paste (not low-sodium or reduced-sodium). Regular tomato and vegetable juice (not low-sodium or reduced-sodium). Pickles. Olives. Fruits Canned fruit in a light or heavy syrup. Fried fruit. Fruit in cream or butter sauce. Meat and other protein foods Fatty cuts of meat. Ribs. Fried meat. Bacon. Sausage. Bologna and other processed lunch meats. Salami. Fatback. Hotdogs. Bratwurst. Salted nuts and seeds. Canned beans with added salt. Canned or smoked fish. Whole eggs or egg yolks. Chicken or turkey with skin. Dairy Whole or 2% milk, cream, and half-and-half. Whole or full-fat cream cheese. Whole-fat or sweetened yogurt. Full-fat cheese. Nondairy creamers. Whipped toppings.  Processed cheese and cheese spreads. Fats and oils Butter. Stick margarine. Lard. Shortening. Ghee. Bacon fat. Tropical oils, such as coconut, palm kernel, or palm oil. Seasoning and other foods Salted popcorn and pretzels. Onion salt, garlic salt, seasoned salt, table salt, and sea salt. Worcestershire sauce. Tartar sauce. Barbecue sauce. Teriyaki sauce. Soy sauce, including reduced-sodium. Steak sauce. Canned and packaged gravies. Fish sauce. Oyster sauce. Cocktail sauce. Horseradish that you find on the shelf. Ketchup. Mustard. Meat flavorings and tenderizers. Bouillon cubes. Hot sauce and Tabasco sauce. Premade or packaged marinades. Premade or packaged taco seasonings. Relishes. Regular salad dressings. Where to find more information:  National Heart, Lung, and Blood Institute: www.nhlbi.nih.gov  American Heart Association: www.heart.org Summary  The DASH eating plan is a healthy eating plan that has been shown to reduce high blood pressure (hypertension). It may also reduce your risk for type 2 diabetes, heart disease, and stroke.  With the DASH eating plan, you should limit salt (sodium) intake to 2,300 mg a day. If you have hypertension, you may need to reduce your sodium intake to 1,500 mg a day.  When on the DASH eating plan, aim to eat more fresh fruits and vegetables, whole grains, lean proteins, low-fat dairy, and heart-healthy fats.  Work with your health care provider or diet and nutrition specialist (dietitian) to adjust your eating plan to your individual   calorie needs. This information is not intended to replace advice given to you by your health care provider. Make sure you discuss any questions you have with your health care provider. Document Released: 03/22/2011 Document Revised: 03/26/2016 Document Reviewed: 03/26/2016 Elsevier Interactive Patient Education  2017 [employer]  

## 2017-02-15 LAB — HIV ANTIBODY (ROUTINE TESTING W REFLEX): HIV 1&2 Ab, 4th Generation: NONREACTIVE

## 2017-02-15 LAB — HEMOGLOBIN A1C
Hgb A1c MFr Bld: 5.1 % of total Hgb (ref <5.7)
Mean Plasma Glucose: 100 (calc)
eAG (mmol/L): 5.5 (calc)

## 2017-02-15 LAB — HEPATITIS PANEL, ACUTE
Hep A IgM: NONREACTIVE
Hep B C IgM: NONREACTIVE
Hepatitis B Surface Ag: NONREACTIVE
Hepatitis C Ab: NONREACTIVE
SIGNAL TO CUT-OFF: 0.01 (ref <1.00)

## 2017-02-15 LAB — LIPID PANEL
Cholesterol: 247 mg/dL — ABNORMAL HIGH (ref <200)
HDL: 67 mg/dL (ref >40)
LDL Cholesterol (Calc): 155 mg/dL (calc) — ABNORMAL HIGH
Non-HDL Cholesterol (Calc): 180 mg/dL (calc) — ABNORMAL HIGH (ref <130)
Total CHOL/HDL Ratio: 3.7 (calc) (ref <5.0)
Triglycerides: 128 mg/dL (ref <150)

## 2017-02-15 LAB — RPR: RPR Ser Ql: NONREACTIVE

## 2017-02-17 ENCOUNTER — Encounter: Payer: Self-pay | Admitting: Family Medicine

## 2017-02-18 ENCOUNTER — Telehealth: Payer: Self-pay | Admitting: *Deleted

## 2017-02-18 NOTE — Telephone Encounter (Signed)
Patient called stating the colonoscopy he has done years ago was by [facility] --[phone] Date of service 09/19/2013

## 2017-02-18 NOTE — Telephone Encounter (Signed)
Done

## 2017-02-18 NOTE — Telephone Encounter (Signed)
Please abstract and update chart accordingly. Thanks. See if we can get the report. Gwen Her. Mannie Stabile, MD
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
## 2017-02-21 ENCOUNTER — Encounter (HOSPITAL_COMMUNITY): Payer: Self-pay | Admitting: Psychiatry

## 2017-02-21 ENCOUNTER — Ambulatory Visit (HOSPITAL_COMMUNITY): Payer: PRIVATE HEALTH INSURANCE | Admitting: Psychiatry

## 2017-02-21 VITALS — BP 153/95 | HR 72 | Wt 245.0 lb

## 2017-02-21 DIAGNOSIS — I10 Essential (primary) hypertension: Secondary | ICD-10-CM

## 2017-02-21 DIAGNOSIS — F419 Anxiety disorder, unspecified: Secondary | ICD-10-CM | POA: Diagnosis not present

## 2017-02-21 DIAGNOSIS — Z79899 Other long term (current) drug therapy: Secondary | ICD-10-CM

## 2017-02-21 DIAGNOSIS — F319 Bipolar disorder, unspecified: Secondary | ICD-10-CM | POA: Diagnosis not present

## 2017-02-21 DIAGNOSIS — F502 Bulimia nervosa: Secondary | ICD-10-CM

## 2017-02-21 DIAGNOSIS — F1021 Alcohol dependence, in remission: Secondary | ICD-10-CM | POA: Diagnosis not present

## 2017-02-21 DIAGNOSIS — G4700 Insomnia, unspecified: Secondary | ICD-10-CM

## 2017-02-21 MED ORDER — LORAZEPAM 0.5 MG PO TABS: 0.5000 mg | ORAL_TABLET | Freq: Every day | ORAL | 0 refills | Status: DC | PRN

## 2017-02-21 MED ORDER — ESCITALOPRAM OXALATE 20 MG PO TABS: 20.0000 mg | ORAL_TABLET | Freq: Every day | ORAL | 0 refills | Status: DC

## 2017-02-21 MED ORDER — TRAZODONE HCL 50 MG PO TABS: 50.0000 mg | ORAL_TABLET | Freq: Every evening | ORAL | 0 refills | Status: DC | PRN

## 2017-02-21 NOTE — Patient Instructions (Signed)
1. Continue carbamazepine 600 mg twice a day  2. Continue lithium 450 mg daily  3. Continue Trazodone 50 mg at night as needed for sleep 4. Continue lexapro 20 mg daily  5. Continue ativan 0.5 mg daily as needed for anxiety  6. Return to clinic in one month for 30 mins

## 2017-02-22 ENCOUNTER — Other Ambulatory Visit: Payer: Self-pay | Admitting: Family Medicine

## 2017-03-13 ENCOUNTER — Other Ambulatory Visit: Payer: Self-pay | Admitting: Family Medicine

## 2017-03-13 DIAGNOSIS — I10 Essential (primary) hypertension: Secondary | ICD-10-CM

## 2017-03-15 ENCOUNTER — Ambulatory Visit (INDEPENDENT_AMBULATORY_CARE_PROVIDER_SITE_OTHER): Payer: PRIVATE HEALTH INSURANCE | Admitting: Licensed Clinical Social Worker

## 2017-03-15 ENCOUNTER — Encounter (HOSPITAL_COMMUNITY): Payer: Self-pay | Admitting: Licensed Clinical Social Worker

## 2017-03-15 DIAGNOSIS — F3130 Bipolar disorder, current episode depressed, mild or moderate severity, unspecified: Secondary | ICD-10-CM

## 2017-03-15 DIAGNOSIS — F319 Bipolar disorder, unspecified: Secondary | ICD-10-CM

## 2017-03-15 DIAGNOSIS — F502 Bulimia nervosa: Secondary | ICD-10-CM

## 2017-03-15 NOTE — Progress Notes (Signed)
   THERAPIST PROGRESS NOTE  Session Time: 10:00 am-10:50 am  Participation Level: Active  Behavioral Response: CasualAlertEuthymic  Type of Therapy: Individual Therapy  Treatment Goals addressed: Coping  Interventions: CBT and Solution Focused  Summary: Calvin Cole is a 61 y.o. male who presents  oriented x5 (person, place, situation, time and object), alert, average height, average weight but feels overweight, tearful at times, dressed for work, appropriately groomed and cooperative to address mood. Patient has a minimal medical treatment history and has a history of mental health treatment that includes outpatient therapy, medication management and hospitalization. Patient admits to symptoms of mania in the past including agitation, increased sexual desire/activity, euphoria, and impulsivity. Patient denies suicidal and homicidal thoughts. He had a recent hospitalization for SI but had no intent to harm self, he wanted to calm down and get help. Patient denies psychosis including auditory and visual hallucinations but admits to ringing in his ears that can be confusing to him. Patient denies substance abuse. He is at low risk for lethality at this time.   Patient had an average score of 4.75 out of 10 on the Outcome Rating Scale which is a increase of 1 since the last session. Patient reported that he has been stable. He had attempted to contact an ex girlfriend who did not respond, so he increased his attempts until she cut off all communication with him. Patient is feeling burned out at work and wants to make a change but is going to wait a year. Patient wants to get back into shape. He notes that his self worth is attached to his weight. Patient plays to prioritize and focus on his work, health and mental health. Patient rated the session 8.75 out of 10 on the Session Rating Scale.   Patient engaged in session. Patient responded well to interventions. Patient continues to meet criteria for  Bipolar 1 disorder and Bulimia nervosa. Patient will continue in outpatient therapy due to being the least restrictive service to meet his needs. Patient made minimal progress on his goal.  Suicidal/Homicidal: Negativewithout intent/plan  Therapist Response: Therapist reviewed patient's recent thoughts and behaviors. Therapist utilized CBT to address mood. Therapist processed patient's feelings to identify triggers for mood. Therapist discussed with patient additional small steps he could take to manage his work, body and mental health.  Therapist administered the Outcome Rating Session and Session Rating Scale.    Plan: Return again in 4 weeks. Therapist will review patient goals on or before 12.18.2018  Diagnosis: Axis I: Bipolar, Depressed and Bulimia nervosa    Axis II: No diagnosis    Glori Bickers, LCSW 03/15/2017

## 2017-03-19 ENCOUNTER — Ambulatory Visit: Payer: BLUE CROSS/BLUE SHIELD | Admitting: Family Medicine

## 2017-03-19 ENCOUNTER — Other Ambulatory Visit: Payer: Self-pay

## 2017-03-19 ENCOUNTER — Encounter: Payer: Self-pay | Admitting: Family Medicine

## 2017-03-19 DIAGNOSIS — E782 Mixed hyperlipidemia: Secondary | ICD-10-CM

## 2017-03-19 DIAGNOSIS — I10 Essential (primary) hypertension: Secondary | ICD-10-CM | POA: Diagnosis not present

## 2017-03-19 DIAGNOSIS — E785 Hyperlipidemia, unspecified: Secondary | ICD-10-CM | POA: Insufficient documentation

## 2017-03-19 MED ORDER — AMLODIPINE BESYLATE 5 MG PO TABS: 5.0000 mg | ORAL_TABLET | Freq: Every day | ORAL | 1 refills | Status: DC

## 2017-03-19 MED ORDER — ATORVASTATIN CALCIUM 20 MG PO TABS: 20.0000 mg | ORAL_TABLET | Freq: Every day | ORAL | 1 refills | Status: DC

## 2017-03-19 NOTE — Patient Instructions (Addendum)
Check Tetanus shot status with previous PCP
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
## 2017-03-21 ENCOUNTER — Ambulatory Visit (HOSPITAL_COMMUNITY): Payer: PRIVATE HEALTH INSURANCE | Admitting: Psychiatry

## 2017-03-21 VITALS — BP 140/86 | HR 85 | Ht 73.0 in | Wt 236.0 lb

## 2017-03-21 DIAGNOSIS — I10 Essential (primary) hypertension: Secondary | ICD-10-CM

## 2017-03-21 DIAGNOSIS — E785 Hyperlipidemia, unspecified: Secondary | ICD-10-CM

## 2017-03-21 DIAGNOSIS — F502 Bulimia nervosa: Secondary | ICD-10-CM | POA: Diagnosis not present

## 2017-03-21 DIAGNOSIS — Z79899 Other long term (current) drug therapy: Secondary | ICD-10-CM

## 2017-03-21 DIAGNOSIS — F1021 Alcohol dependence, in remission: Secondary | ICD-10-CM | POA: Diagnosis not present

## 2017-03-21 DIAGNOSIS — F319 Bipolar disorder, unspecified: Secondary | ICD-10-CM | POA: Diagnosis not present

## 2017-03-21 DIAGNOSIS — Z818 Family history of other mental and behavioral disorders: Secondary | ICD-10-CM

## 2017-03-21 DIAGNOSIS — Z62810 Personal history of physical and sexual abuse in childhood: Secondary | ICD-10-CM | POA: Diagnosis not present

## 2017-03-21 MED ORDER — LITHIUM CARBONATE ER 450 MG PO TBCR
450.0000 mg | EXTENDED_RELEASE_TABLET | Freq: Every day | ORAL | 0 refills | Status: DC
Start: 2017-03-21 — End: 2017-06-17

## 2017-03-21 MED ORDER — TRAZODONE HCL 50 MG PO TABS: 50.0000 mg | ORAL_TABLET | Freq: Every evening | ORAL | 0 refills | Status: DC | PRN

## 2017-03-21 MED ORDER — CARBAMAZEPINE 200 MG PO TABS: 600.0000 mg | ORAL_TABLET | Freq: Two times a day (BID) | ORAL | 0 refills | Status: DC

## 2017-03-21 MED ORDER — ESCITALOPRAM OXALATE 20 MG PO TABS: 20.0000 mg | ORAL_TABLET | Freq: Every day | ORAL | 0 refills | Status: DC

## 2017-03-21 NOTE — Patient Instructions (Signed)
1. Continue carbamazepine 600 mg twice a day  2. Continue lithium 450 mg daily  3. Continue lexapro 20 mg daily  4. Continue ativan 0.5 mg daily as needed for anxiety  5. Continue Trazodone 50 mg at night as needed for sleep 6. Return to clinic in two month for 30 mins

## 2017-04-02 ENCOUNTER — Telehealth (HOSPITAL_COMMUNITY): Payer: Self-pay

## 2017-04-02 NOTE — Telephone Encounter (Signed)
Error in charting.

## 2017-04-02 NOTE — Telephone Encounter (Signed)
Calvin Cole   Writer followed up with patient and reminded him of his upcomeing appt with Dr. Modesta Messing.  Patient denies SI/HI/Psychosos/Substance Abuse.  Patient was provided with the [facility] phone number again and remindedthe patient that if he had any problems during the holidays to contact us on the [facility]phone or he could contact the [facility] crisis line.

## 2017-04-18 ENCOUNTER — Ambulatory Visit (HOSPITAL_COMMUNITY): Payer: Self-pay | Admitting: Licensed Clinical Social Worker

## 2017-04-27 ENCOUNTER — Telehealth: Payer: Self-pay

## 2017-05-16 ENCOUNTER — Telehealth (HOSPITAL_COMMUNITY): Payer: Self-pay | Admitting: Psychiatry

## 2017-05-16 MED ORDER — LORAZEPAM 0.5 MG PO TABS: 0.5000 mg | ORAL_TABLET | Freq: Every day | ORAL | 0 refills | Status: DC | PRN
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
## 2017-05-16 NOTE — Telephone Encounter (Signed)
Received request for ativan refill. Ativan last filled on 04/11/2017. Will order for a month.  I have utilized the [place] Controlled Substances Reporting System (PMP AWARxE) to confirm adherence regarding the patient's medication. My review reveals appropriate prescription fills.

## 2017-05-22 ENCOUNTER — Ambulatory Visit (HOSPITAL_COMMUNITY): Payer: PRIVATE HEALTH INSURANCE | Admitting: Psychiatry

## 2017-05-22 ENCOUNTER — Encounter (HOSPITAL_COMMUNITY): Payer: Self-pay | Admitting: Psychiatry

## 2017-05-22 VITALS — BP 133/83 | HR 78 | Ht 73.0 in | Wt 231.0 lb

## 2017-05-22 DIAGNOSIS — G4700 Insomnia, unspecified: Secondary | ICD-10-CM

## 2017-05-22 DIAGNOSIS — Z810 Family history of intellectual disabilities: Secondary | ICD-10-CM | POA: Diagnosis not present

## 2017-05-22 DIAGNOSIS — F502 Bulimia nervosa: Secondary | ICD-10-CM

## 2017-05-22 DIAGNOSIS — F3130 Bipolar disorder, current episode depressed, mild or moderate severity, unspecified: Secondary | ICD-10-CM

## 2017-05-22 DIAGNOSIS — Z599 Problem related to housing and economic circumstances, unspecified: Secondary | ICD-10-CM | POA: Diagnosis not present

## 2017-05-22 DIAGNOSIS — F419 Anxiety disorder, unspecified: Secondary | ICD-10-CM

## 2017-05-22 DIAGNOSIS — R45851 Suicidal ideations: Secondary | ICD-10-CM | POA: Diagnosis not present

## 2017-05-22 DIAGNOSIS — R450 Nervousness: Secondary | ICD-10-CM

## 2017-05-22 MED ORDER — LORAZEPAM 0.5 MG PO TABS: 0.5000 mg | ORAL_TABLET | Freq: Every day | ORAL | 0 refills | Status: DC | PRN

## 2017-05-22 MED ORDER — LITHIUM CARBONATE ER 300 MG PO TBCR: 300.0000 mg | EXTENDED_RELEASE_TABLET | Freq: Two times a day (BID) | ORAL | 0 refills | Status: DC

## 2017-05-22 NOTE — Patient Instructions (Addendum)
1. Continue carbamazepine 600 mg twice a day 2. Increase lithium 300 mg twice a day 3. Continue lexapro 20 mg daily  4. Continue ativan 0.5 mg daily as needed for anxiety (refill after 2/26) 5. Continue Trazodone 50 mg at night as needed for sleep 6.Return to clinic in two month for 30 mins 7. Obtain blood test next Tuesday 8. Take a walk or do biking every day

## 2017-06-17 ENCOUNTER — Other Ambulatory Visit (HOSPITAL_COMMUNITY): Payer: Self-pay | Admitting: Psychiatry

## 2017-06-17 MED ORDER — LITHIUM CARBONATE ER 300 MG PO TBCR: 300.0000 mg | EXTENDED_RELEASE_TABLET | Freq: Two times a day (BID) | ORAL | 0 refills | Status: DC

## 2017-06-18 ENCOUNTER — Ambulatory Visit: Payer: Self-pay | Admitting: Family Medicine

## 2017-06-22 LAB — LIPID PANEL
Cholesterol: 201 mg/dL — ABNORMAL HIGH (ref <200)
HDL: 61 mg/dL (ref >40)
LDL Cholesterol (Calc): 117 mg/dL (calc) — ABNORMAL HIGH
Non-HDL Cholesterol (Calc): 140 mg/dL (calc) — ABNORMAL HIGH (ref <130)
Total CHOL/HDL Ratio: 3.3 (calc) (ref <5.0)
Triglycerides: 122 mg/dL (ref <150)

## 2017-06-23 LAB — BASIC METABOLIC PANEL
BUN: 14 mg/dL (ref 7–25)
CO2: 30 mmol/L (ref 20–32)
Calcium: 9.1 mg/dL (ref 8.6–10.3)
Chloride: 106 mmol/L (ref 98–110)
Creat: 0.79 mg/dL (ref 0.70–1.33)
Glucose, Bld: 96 mg/dL (ref 65–99)
Potassium: 4.6 mmol/L (ref 3.5–5.3)
Sodium: 139 mmol/L (ref 135–146)

## 2017-06-23 LAB — LITHIUM LEVEL: Lithium Lvl: 0.7 mmol/L (ref 0.6–1.2)

## 2017-06-24 ENCOUNTER — Encounter (HOSPITAL_COMMUNITY): Payer: Self-pay | Admitting: Psychiatry

## 2017-06-25 ENCOUNTER — Encounter: Payer: Self-pay | Admitting: Family Medicine
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
## 2017-07-03 ENCOUNTER — Ambulatory Visit (HOSPITAL_COMMUNITY): Payer: PRIVATE HEALTH INSURANCE | Admitting: Psychiatry

## 2017-07-03 ENCOUNTER — Encounter (HOSPITAL_COMMUNITY): Payer: Self-pay | Admitting: Psychiatry

## 2017-07-03 VITALS — BP 145/87 | HR 84 | Ht 73.0 in | Wt 237.0 lb

## 2017-07-03 DIAGNOSIS — F502 Bulimia nervosa: Secondary | ICD-10-CM | POA: Diagnosis not present

## 2017-07-03 DIAGNOSIS — F3130 Bipolar disorder, current episode depressed, mild or moderate severity, unspecified: Secondary | ICD-10-CM

## 2017-07-03 DIAGNOSIS — Z810 Family history of intellectual disabilities: Secondary | ICD-10-CM | POA: Diagnosis not present

## 2017-07-03 MED ORDER — LITHIUM CARBONATE ER 450 MG PO TBCR: 450.0000 mg | EXTENDED_RELEASE_TABLET | Freq: Every day | ORAL | 0 refills | Status: DC

## 2017-07-03 MED ORDER — LITHIUM CARBONATE ER 300 MG PO TBCR: 300.0000 mg | EXTENDED_RELEASE_TABLET | Freq: Every day | ORAL | 0 refills | Status: DC

## 2017-07-03 MED ORDER — ESCITALOPRAM OXALATE 20 MG PO TABS: 20.0000 mg | ORAL_TABLET | Freq: Every day | ORAL | 0 refills | Status: DC

## 2017-07-03 NOTE — Patient Instructions (Addendum)
1. Continue carbamazepine 600 mg twice a day 2. Continue lithium 300 mg in AM, 450 mg in PM 3. Continue lexapro 20 mg daily 4. Continue ativan 0.5 mg daily as needed for anxiety(refill after 2/26) 5. Continue Trazodone 50 mg at night as needed for sleep 6. Obtain blood test in April 7.Return to clinic in two months for 30 mins

## 2017-07-05 ENCOUNTER — Encounter: Payer: Self-pay | Admitting: Family Medicine

## 2017-07-05 ENCOUNTER — Ambulatory Visit: Payer: BLUE CROSS/BLUE SHIELD | Admitting: Family Medicine

## 2017-07-05 ENCOUNTER — Other Ambulatory Visit: Payer: Self-pay

## 2017-07-05 VITALS — BP 134/84 | HR 89 | Temp 98.4°F | Resp 16 | Ht 73.0 in | Wt 237.8 lb

## 2017-07-05 DIAGNOSIS — E782 Mixed hyperlipidemia: Secondary | ICD-10-CM

## 2017-07-05 DIAGNOSIS — Z79899 Other long term (current) drug therapy: Secondary | ICD-10-CM | POA: Diagnosis not present

## 2017-07-05 DIAGNOSIS — E661 Drug-induced obesity: Secondary | ICD-10-CM

## 2017-07-05 DIAGNOSIS — Z6831 Body mass index (BMI) 31.0-31.9, adult: Secondary | ICD-10-CM

## 2017-07-05 DIAGNOSIS — I10 Essential (primary) hypertension: Secondary | ICD-10-CM

## 2017-07-05 DIAGNOSIS — F502 Bulimia nervosa: Secondary | ICD-10-CM

## 2017-07-05 MED ORDER — ATORVASTATIN CALCIUM 40 MG PO TABS: 40.0000 mg | ORAL_TABLET | Freq: Every day | ORAL | 3 refills | Status: DC
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
## 2017-07-05 NOTE — Patient Instructions (Signed)
Call insurance company about Shingrix, if you want the shot, you can come back for a nurse visit or wait until follow up.

## 2017-07-12 ENCOUNTER — Telehealth (HOSPITAL_COMMUNITY): Payer: Self-pay | Admitting: *Deleted

## 2017-07-12 NOTE — Telephone Encounter (Signed)
Discussed with patient. He has started to have tremors and feels nervous about it. He has been taking liquid naploxen twice a day (no change in dose recently). Discussed side effect of lithium toxicity and Instructed the following.  - Decrease lithium 300 mg BID - Obtain blood test early next week (lithium. BMP) as patient is unable to get blood test today - Make sure to keep hydrated. Limit naploxen use - If any worsening in his symptoms, he is advised to go to urgent care or emergency room for evaluation.

## 2017-07-12 NOTE — Telephone Encounter (Signed)
Dr Modesta Messing Patient has called stating that he needs to speak with you. He stated that some of his symptoms Have returned. And he needs t speak with you. # [phone]

## 2017-07-16 ENCOUNTER — Telehealth (HOSPITAL_COMMUNITY): Payer: Self-pay | Admitting: *Deleted

## 2017-07-16 NOTE — Telephone Encounter (Signed)
Patient Stated his work schedule is very hectic. Stated he's up @ 2:30 every morning for work. Stated he would try to get to lab this week if possible. Informed him that [facility] lab on the other end of the hall is open on Sat from 8-12. He stated he has use them before & may go by there then. Stated that he has taking himself off the Lithium due to the tremors & weight gain. Stated tremors are not as extreme since stopping Lithium & didn't get severe until he started the Lithium. Informed him that we are here for him to please call us.
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
## 2017-09-03 ENCOUNTER — Encounter (HOSPITAL_COMMUNITY): Payer: Self-pay | Admitting: Psychiatry

## 2017-09-03 ENCOUNTER — Ambulatory Visit (INDEPENDENT_AMBULATORY_CARE_PROVIDER_SITE_OTHER): Payer: 59 | Admitting: Psychiatry

## 2017-09-03 VITALS — BP 145/81 | HR 66 | Ht 73.0 in | Wt 237.0 lb

## 2017-09-03 DIAGNOSIS — Z810 Family history of intellectual disabilities: Secondary | ICD-10-CM

## 2017-09-03 DIAGNOSIS — F3130 Bipolar disorder, current episode depressed, mild or moderate severity, unspecified: Secondary | ICD-10-CM

## 2017-09-03 DIAGNOSIS — F419 Anxiety disorder, unspecified: Secondary | ICD-10-CM

## 2017-09-03 DIAGNOSIS — R450 Nervousness: Secondary | ICD-10-CM

## 2017-09-03 DIAGNOSIS — F502 Bulimia nervosa: Secondary | ICD-10-CM

## 2017-09-03 MED ORDER — ESCITALOPRAM OXALATE 20 MG PO TABS: 20.0000 mg | ORAL_TABLET | Freq: Every day | ORAL | 0 refills | Status: DC

## 2017-09-03 MED ORDER — CARBAMAZEPINE 200 MG PO TABS: 600.0000 mg | ORAL_TABLET | Freq: Two times a day (BID) | ORAL | 0 refills | Status: DC

## 2017-09-03 MED ORDER — LORAZEPAM 0.5 MG PO TABS: 0.5000 mg | ORAL_TABLET | Freq: Every day | ORAL | 2 refills | Status: DC | PRN

## 2017-09-03 NOTE — Patient Instructions (Addendum)
1. Continue carbamazepine 600 mg twice a day 2.Discontinue lithium  3. Continue lexapro 20 mg daily 4. Continue ativan 0.5 mg daily as needed for anxiety 5. Continue Trazodone 50 mg at night as needed for sleep 6. Return to clinic in three months  for 30 mins

## 2017-09-19 ENCOUNTER — Encounter: Payer: Self-pay | Admitting: Family Medicine

## 2017-09-20 ENCOUNTER — Encounter: Payer: Self-pay | Admitting: Family Medicine

## 2017-10-03 NOTE — Telephone Encounter (Signed)
Error in charting.

## 2017-10-16 ENCOUNTER — Telehealth: Payer: Self-pay | Admitting: Family Medicine

## 2017-10-16 ENCOUNTER — Other Ambulatory Visit: Payer: Self-pay | Admitting: Family Medicine

## 2017-10-16 DIAGNOSIS — I10 Essential (primary) hypertension: Secondary | ICD-10-CM

## 2017-10-16 NOTE — Telephone Encounter (Signed)
Patient left voicemail to schedule appt, he said a followup & a cpe. So I left a voicemail needing clarification on which Cb#: [phone]

## 2017-10-21 ENCOUNTER — Telehealth: Payer: Self-pay | Admitting: Family Medicine
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
## 2017-11-28 ENCOUNTER — Encounter: Payer: Self-pay | Admitting: Family Medicine

## 2017-12-02 ENCOUNTER — Telehealth: Payer: Self-pay | Admitting: Family Medicine

## 2017-12-02 NOTE — Telephone Encounter (Signed)
Requesting labs prior to his cpe  [facility]. [phone]

## 2017-12-03 NOTE — Telephone Encounter (Signed)
Left generic message requesting call back.  

## 2017-12-03 NOTE — Telephone Encounter (Signed)
Advise best to draw at time of physical so we can discuss labs. Calvin Her. Mannie Stabile, MD

## 2017-12-04 ENCOUNTER — Encounter (HOSPITAL_COMMUNITY): Payer: Self-pay | Admitting: Psychiatry

## 2017-12-04 ENCOUNTER — Ambulatory Visit (INDEPENDENT_AMBULATORY_CARE_PROVIDER_SITE_OTHER): Payer: Commercial Managed Care - PPO | Admitting: Psychiatry

## 2017-12-04 VITALS — BP 133/89 | HR 64 | Ht 73.0 in | Wt 245.0 lb

## 2017-12-04 DIAGNOSIS — F3130 Bipolar disorder, current episode depressed, mild or moderate severity, unspecified: Secondary | ICD-10-CM

## 2017-12-04 DIAGNOSIS — F502 Bulimia nervosa, unspecified: Secondary | ICD-10-CM

## 2017-12-04 LAB — HEPATIC FUNCTION PANEL
AG Ratio: 2 (calc) (ref 1.0–2.5)
ALT: 20 U/L (ref 9–46)
AST: 17 U/L (ref 10–35)
Albumin: 4 g/dL (ref 3.6–5.1)
Alkaline phosphatase (APISO): 45 U/L (ref 40–115)
Bilirubin, Direct: 0.1 mg/dL (ref 0.0–0.2)
Globulin: 2 g/dL (calc) (ref 1.9–3.7)
Indirect Bilirubin: 0.2 mg/dL (calc) (ref 0.2–1.2)
Total Bilirubin: 0.3 mg/dL (ref 0.2–1.2)
Total Protein: 6 g/dL — ABNORMAL LOW (ref 6.1–8.1)

## 2017-12-04 LAB — LIPID PANEL
Cholesterol: 174 mg/dL (ref <200)
HDL: 65 mg/dL (ref >40)
LDL Cholesterol (Calc): 94 mg/dL (calc)
Non-HDL Cholesterol (Calc): 109 mg/dL (calc) (ref <130)
Total CHOL/HDL Ratio: 2.7 (calc) (ref <5.0)
Triglycerides: 60 mg/dL (ref <150)

## 2017-12-04 MED ORDER — CARBAMAZEPINE 200 MG PO TABS: 600.0000 mg | ORAL_TABLET | Freq: Two times a day (BID) | ORAL | 0 refills | Status: DC

## 2017-12-04 MED ORDER — ESCITALOPRAM OXALATE 20 MG PO TABS: 20.0000 mg | ORAL_TABLET | Freq: Every day | ORAL | 0 refills | Status: DC

## 2017-12-04 MED ORDER — LORAZEPAM 0.5 MG PO TABS: 0.5000 mg | ORAL_TABLET | Freq: Every day | ORAL | 0 refills | Status: DC | PRN

## 2017-12-04 NOTE — Patient Instructions (Signed)
1. 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 Continue lexapro 20 mg daily 4. Continue ativan 0.5 mg daily as needed for anxiety 5. Continue Trazodone 50 mg at night as needed for sleep 6. Return to clinic in one month for 30 mins

## 2017-12-04 NOTE — Telephone Encounter (Signed)
Called patient to let him know Dr.Haglers response. No answer.

## 2017-12-05 ENCOUNTER — Telehealth (HOSPITAL_COMMUNITY): Payer: Self-pay | Admitting: *Deleted

## 2017-12-05 ENCOUNTER — Other Ambulatory Visit (HOSPITAL_COMMUNITY): Payer: Self-pay | Admitting: Psychiatry

## 2017-12-05 ENCOUNTER — Encounter: Payer: Self-pay | Admitting: Family Medicine

## 2017-12-05 MED ORDER — BUPROPION HCL ER (XL) 150 MG PO TB24: 150.0000 mg | ORAL_TABLET | Freq: Every day | ORAL | 0 refills | Status: DC

## 2017-12-05 NOTE — Telephone Encounter (Signed)
Dr Modesta Messing  Patient called stating that Rx wasn't sent in & that he is suppose to Start Wellbutrin 150 mg daily

## 2017-12-05 NOTE — Telephone Encounter (Signed)
Patient called in and stated he went and had labs drawn this morning.

## 2017-12-05 NOTE — Telephone Encounter (Signed)
Sorry I may have missed to finalize the order. Sent in Wellbutrin now.

## 2017-12-05 NOTE — Telephone Encounter (Signed)
Dr. Hisada's pt 

## 2017-12-10 ENCOUNTER — Ambulatory Visit (INDEPENDENT_AMBULATORY_CARE_PROVIDER_SITE_OTHER): Payer: Commercial Managed Care - PPO | Admitting: Family Medicine

## 2017-12-10 ENCOUNTER — Encounter: Payer: Self-pay | Admitting: Family Medicine

## 2017-12-10 ENCOUNTER — Other Ambulatory Visit: Payer: Self-pay

## 2017-12-10 VITALS — BP 136/86 | HR 73 | Temp 98.0°F | Resp 12 | Ht 73.5 in | Wt 244.1 lb

## 2017-12-10 DIAGNOSIS — Z23 Encounter for immunization: Secondary | ICD-10-CM

## 2017-12-10 DIAGNOSIS — E782 Mixed hyperlipidemia: Secondary | ICD-10-CM | POA: Diagnosis not present

## 2017-12-10 DIAGNOSIS — Z0000 Encounter for general adult medical examination without abnormal findings: Secondary | ICD-10-CM | POA: Diagnosis not present

## 2017-12-10 DIAGNOSIS — I10 Essential (primary) hypertension: Secondary | ICD-10-CM | POA: Diagnosis not present

## 2017-12-10 MED ORDER — ATORVASTATIN CALCIUM 40 MG PO TABS: 40.0000 mg | ORAL_TABLET | Freq: Every day | ORAL | 1 refills | Status: DC

## 2017-12-10 MED ORDER — AMLODIPINE BESYLATE 5 MG PO TABS: 5.0000 mg | ORAL_TABLET | Freq: Every day | ORAL | 0 refills | Status: DC
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
## 2018-01-06 ENCOUNTER — Ambulatory Visit (INDEPENDENT_AMBULATORY_CARE_PROVIDER_SITE_OTHER): Payer: Commercial Managed Care - PPO | Admitting: Psychiatry

## 2018-01-06 ENCOUNTER — Encounter (HOSPITAL_COMMUNITY): Payer: Self-pay | Admitting: Psychiatry

## 2018-01-06 VITALS — BP 132/88 | HR 71 | Ht 73.5 in | Wt 243.0 lb

## 2018-01-06 DIAGNOSIS — F3130 Bipolar disorder, current episode depressed, mild or moderate severity, unspecified: Secondary | ICD-10-CM

## 2018-01-06 DIAGNOSIS — F502 Bulimia nervosa: Secondary | ICD-10-CM

## 2018-01-06 DIAGNOSIS — Z810 Family history of intellectual disabilities: Secondary | ICD-10-CM | POA: Diagnosis not present

## 2018-01-06 MED ORDER — BUPROPION HCL ER (XL) 150 MG PO TB24: 150.0000 mg | ORAL_TABLET | Freq: Every day | ORAL | 0 refills | Status: DC

## 2018-01-06 MED ORDER — CARBAMAZEPINE 200 MG PO TABS: 600.0000 mg | ORAL_TABLET | Freq: Two times a day (BID) | ORAL | 0 refills | Status: DC

## 2018-01-06 MED ORDER — ESCITALOPRAM OXALATE 20 MG PO TABS: 20.0000 mg | ORAL_TABLET | Freq: Every day | ORAL | 0 refills | Status: DC

## 2018-01-06 NOTE — Patient Instructions (Addendum)
1. Continue carbamazepine 600 mg twice a day 2.Continue Wellbutrin 150 mg daily  3. Continue Lexapro 20 mg daily 4. Continue ativan 0.5 mg daily as needed for anxiety 5. Discontinue Trazodone 6.Return to clinic in three months for 30 mins

## 2018-02-19 ENCOUNTER — Other Ambulatory Visit (HOSPITAL_COMMUNITY): Payer: Self-pay | Admitting: Psychiatry

## 2018-02-19 MED ORDER — LORAZEPAM 0.5 MG PO TABS: 0.5000 mg | ORAL_TABLET | Freq: Every day | ORAL | 0 refills | Status: DC | PRN

## 2018-04-02 ENCOUNTER — Other Ambulatory Visit (HOSPITAL_COMMUNITY): Payer: Self-pay | Admitting: Psychiatry

## 2018-04-02 MED ORDER — ESCITALOPRAM OXALATE 20 MG PO TABS: 20.0000 mg | ORAL_TABLET | Freq: Every day | ORAL | 0 refills | Status: DC

## 2018-04-02 MED ORDER — CARBAMAZEPINE 200 MG PO TABS: 600.0000 mg | ORAL_TABLET | Freq: Two times a day (BID) | ORAL | 0 refills | Status: DC

## 2018-04-02 MED ORDER — BUPROPION HCL ER (XL) 150 MG PO TB24: 150.0000 mg | ORAL_TABLET | Freq: Every day | ORAL | 0 refills | Status: DC
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
## 2018-04-14 ENCOUNTER — Ambulatory Visit (HOSPITAL_COMMUNITY): Payer: Commercial Managed Care - PPO | Admitting: Psychiatry

## 2018-05-05 ENCOUNTER — Other Ambulatory Visit: Payer: Self-pay | Admitting: Family Medicine

## 2018-05-05 DIAGNOSIS — I10 Essential (primary) hypertension: Secondary | ICD-10-CM

## 2018-05-06 ENCOUNTER — Other Ambulatory Visit (HOSPITAL_COMMUNITY): Payer: Self-pay | Admitting: Psychiatry

## 2018-05-06 MED ORDER — LORAZEPAM 0.5 MG PO TABS: 0.5000 mg | ORAL_TABLET | Freq: Every day | ORAL | 0 refills | Status: DC | PRN

## 2018-05-14 ENCOUNTER — Other Ambulatory Visit: Payer: Self-pay | Admitting: Orthopedic Surgery

## 2018-05-14 DIAGNOSIS — M25511 Pain in right shoulder: Secondary | ICD-10-CM
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
## 2018-05-22 ENCOUNTER — Ambulatory Visit (INDEPENDENT_AMBULATORY_CARE_PROVIDER_SITE_OTHER): Payer: Commercial Managed Care - PPO | Admitting: Psychiatry

## 2018-05-22 ENCOUNTER — Encounter (HOSPITAL_COMMUNITY): Payer: Self-pay | Admitting: Psychiatry

## 2018-05-22 VITALS — BP 150/94 | HR 73 | Ht 73.5 in | Wt 246.0 lb

## 2018-05-22 DIAGNOSIS — F3130 Bipolar disorder, current episode depressed, mild or moderate severity, unspecified: Secondary | ICD-10-CM

## 2018-05-22 MED ORDER — CARBAMAZEPINE 200 MG PO TABS: 600.0000 mg | ORAL_TABLET | Freq: Two times a day (BID) | ORAL | 0 refills | Status: DC

## 2018-05-22 MED ORDER — ESCITALOPRAM OXALATE 20 MG PO TABS: 20.0000 mg | ORAL_TABLET | Freq: Every day | ORAL | 0 refills | Status: DC

## 2018-05-22 MED ORDER — BUPROPION HCL ER (XL) 150 MG PO TB24: 150.0000 mg | ORAL_TABLET | Freq: Every day | ORAL | 0 refills | Status: DC

## 2018-05-22 NOTE — Patient Instructions (Signed)
1. Continue carbamazepine 600 mg twice a day 2.Continue Bupropion 150 mg daily 3. ContinueLexapro20 mg daily 4. Continue ativan 0.5 mg daily as needed for anxiety 5. Return to clinic inthree months for 15 mins - consider checking blood test (CBC)

## 2018-05-28 ENCOUNTER — Ambulatory Visit
Admission: RE | Admit: 2018-05-28 | Discharge: 2018-05-28 | Disposition: A | Discharge status: Home / Self Care | Payer: Commercial Managed Care - PPO | Source: Ambulatory Visit | Attending: Orthopedic Surgery | Admitting: Orthopedic Surgery

## 2018-05-28 DIAGNOSIS — M19011 Primary osteoarthritis, right shoulder: Secondary | ICD-10-CM | POA: Diagnosis not present

## 2018-05-28 DIAGNOSIS — M25511 Pain in right shoulder: Secondary | ICD-10-CM

## 2018-05-28 DIAGNOSIS — M75101 Unspecified rotator cuff tear or rupture of right shoulder, not specified as traumatic: Secondary | ICD-10-CM | POA: Insufficient documentation

## 2018-05-28 MED ORDER — SODIUM CHLORIDE (PF) 0.9 % IJ SOLN
10.0000 mL | Freq: Once | INTRAMUSCULAR | Status: AC
  Administered 2018-05-28: 10 mL via INTRAVENOUS

## 2018-05-28 MED ORDER — LIDOCAINE HCL (PF) 1 % IJ SOLN
5.0000 mL | Freq: Once | INTRAMUSCULAR | Status: AC
  Administered 2018-05-28: 5 mL
  Filled 2018-05-28: qty 5

## 2018-05-28 MED ORDER — IOPAMIDOL (ISOVUE-300) INJECTION 61%
50.0000 mL | Freq: Once | INTRAVENOUS | Status: AC | PRN
  Administered 2018-05-28: 25 mL

## 2018-05-28 MED ORDER — GADOBUTROL 1 MMOL/ML IV SOLN
2.0000 mL | Freq: Once | INTRAVENOUS | Status: AC | PRN
  Administered 2018-05-28: 1 mL

## 2018-06-02 ENCOUNTER — Other Ambulatory Visit: Payer: Self-pay | Admitting: Orthopedic Surgery

## 2018-06-13 ENCOUNTER — Inpatient Hospital Stay: Admission: RE | Admit: 2018-06-13 | Payer: Self-pay | Source: Ambulatory Visit

## 2018-06-16 ENCOUNTER — Inpatient Hospital Stay: Admission: RE | Admit: 2018-06-16 | Payer: Self-pay | Source: Ambulatory Visit

## 2018-06-17 ENCOUNTER — Other Ambulatory Visit: Payer: Self-pay

## 2018-06-17 ENCOUNTER — Encounter
Admission: RE | Admit: 2018-06-17 | Discharge: 2018-06-17 | Disposition: A | Discharge status: Home / Self Care | Payer: Commercial Managed Care - PPO | Source: Ambulatory Visit | Attending: Orthopedic Surgery | Admitting: Orthopedic Surgery

## 2018-06-17 HISTORY — DX: Anxiety disorder, unspecified: F41.9

## 2018-06-17 HISTORY — DX: Sleep apnea, unspecified: G47.30

## 2018-06-17 HISTORY — DX: Unspecified osteoarthritis, unspecified site: M19.90

## 2018-06-17 HISTORY — DX: Gastro-esophageal reflux disease without esophagitis: K21.9

## 2018-06-17 HISTORY — DX: Personal history of urinary calculi: Z87.442

## 2018-06-17 HISTORY — DX: Personal history of Methicillin resistant Staphylococcus aureus infection: Z86.14

## 2018-06-17 NOTE — Patient Instructions (Signed)
Your procedure is scheduled on: 06-24-18 TUESDAY Report to Same Day Surgery 2nd floor medical mall [facility] Entrance-take elevator on left to 2nd floor.  Check in with surgery information desk.) To find out your arrival time please call [phone] between 1PM - 3PM on 06-23-18 MONDAY  Remember: Instructions that are not followed completely may result in serious medical risk, up to and including death, or upon the discretion of your surgeon and anesthesiologist your surgery may need to be rescheduled.    _x___ 1. Do not eat food after midnight the night before your procedure. NO GUM OR CANDY AFTER MIDNIGHT. You may drink clear liquids up to 2 hours before you are scheduled to arrive at the hospital for your procedure.  Do not drink clear liquids within 2 hours of your scheduled arrival to the hospital.  Clear liquids include  --Water or Apple juice without pulp  --Clear carbohydrate beverage such as ClearFast or Gatorade  --Black Coffee or Clear Tea (No milk, no creamers, do not add anything to the coffee or Tea   ____Ensure clear carbohydrate drink on the way to the hospital for bariatric patients  ____Ensure clear carbohydrate drink 3 hours before surgery for Dr Dwyane Luo patients if physician instructed.    __x__ 2. No Alcohol for 24 hours before or after surgery.   __x__3. No Smoking or e-cigarettes for 24 prior to surgery.  Do not use any chewable tobacco products for at least 6 hour prior to surgery   ____  4. Bring all medications with you on the day of surgery if instructed.    __x__ 5. Notify your doctor if there is any change in your medical condition     (cold, fever, infections).    x___6. On the morning of surgery brush your teeth with toothpaste and water.  You may rinse your mouth with mouth wash if you wish.  Do not swallow any toothpaste or mouthwash.   Do not wear jewelry, make-up, hairpins, clips or nail polish.  Do not wear lotions, powders, or perfumes. You  may wear deodorant.  Do not shave 48 hours prior to surgery. Men may shave face and neck.  Do not bring valuables to the hospital.    [facility] is not responsible for any belongings or valuables.               Contacts, dentures or bridgework may not be worn into surgery.  Leave your suitcase in the car. After surgery it may be brought to your room.  For patients admitted to the hospital, discharge time is determined by your treatment team.  _  Patients discharged the day of surgery will not be allowed to drive home.  You will need someone to drive you home and stay with you the night of your procedure.    Please read over the following fact sheets that you were given:   [facility] Preparing for Surgery  _x___ TAKE THE FOLLOWING MEDICATION THE MORNING OF SURGERY WITH A SMALL SIP OF WATER. These include:  1. WELLBUTRIN   2. TEGRETOL  3. LEXAPRO  4. FAMOTIDINE  5. TAKE A FAMOTIDINE THE NIGHT BEFORE YOUR SURGERY  6. YOU MAY TAKE ATIVAN IF NEEDED DAY OF SURGERY  ____Fleets enema or Magnesium Citrate as directed.   _x___ Use CHG Soap or sage wipes as directed on instruction sheet   ____ Use inhalers on the day of surgery and bring to hospital day of surgery  ____ Stop Metformin and Janumet  2 days prior to surgery.    ____ Take 1/2 of usual insulin dose the night before surgery and none on the morning surgery.   _x___ Follow recommendations from Cardiologist, Pulmonologist or PCP regarding stopping Aspirin, Coumadin, Plavix ,Eliquis, Effient, or Pradaxa, and Pletal-PT HAS ALREADY STOPPED ASPIRIN  X____Stop Anti-inflammatories such as Advil, Aleve, Ibuprofen, Motrin, Naproxen,MELOXICAM, Naprosyn, Goodies powders or aspirin products NOW- OK to take Tylenol    ____ Stop supplements until after surgery.     ____ Bring C-Pap to the hospital.

## 2018-06-18 ENCOUNTER — Encounter
Admission: RE | Admit: 2018-06-18 | Discharge: 2018-06-18 | Disposition: A | Discharge status: Home / Self Care | Payer: Commercial Managed Care - PPO | Source: Ambulatory Visit | Attending: Orthopedic Surgery | Admitting: Orthopedic Surgery

## 2018-06-18 ENCOUNTER — Other Ambulatory Visit: Payer: Self-pay

## 2018-06-18 DIAGNOSIS — I10 Essential (primary) hypertension: Secondary | ICD-10-CM | POA: Insufficient documentation

## 2018-06-18 DIAGNOSIS — Z01818 Encounter for other preprocedural examination: Secondary | ICD-10-CM | POA: Diagnosis present

## 2018-06-18 LAB — CBC WITH DIFFERENTIAL/PLATELET
Abs Immature Granulocytes: 0.02 10*3/uL (ref 0.00–0.07)
Basophils Absolute: 0.1 10*3/uL (ref 0.0–0.1)
Basophils Relative: 1 %
Eosinophils Absolute: 0.1 10*3/uL (ref 0.0–0.5)
Eosinophils Relative: 2 %
HCT: 43.3 % (ref 39.0–52.0)
Hemoglobin: 14.3 g/dL (ref 13.0–17.0)
Immature Granulocytes: 0 %
Lymphocytes Relative: 26 %
Lymphs Abs: 1.2 10*3/uL (ref 0.7–4.0)
MCH: 32.9 pg (ref 26.0–34.0)
MCHC: 33 g/dL (ref 30.0–36.0)
MCV: 99.8 fL (ref 80.0–100.0)
Monocytes Absolute: 0.4 10*3/uL (ref 0.1–1.0)
Monocytes Relative: 8 %
Neutro Abs: 3 10*3/uL (ref 1.7–7.7)
Neutrophils Relative %: 63 %
Platelets: 214 10*3/uL (ref 150–400)
RBC: 4.34 MIL/uL (ref 4.22–5.81)
RDW: 12.6 % (ref 11.5–15.5)
WBC: 4.7 10*3/uL (ref 4.0–10.5)
nRBC: 0 % (ref 0.0–0.2)

## 2018-06-18 LAB — BASIC METABOLIC PANEL
Anion gap: 8 (ref 5–15)
BUN: 25 mg/dL — ABNORMAL HIGH (ref 6–20)
CO2: 24 mmol/L (ref 22–32)
Calcium: 8.9 mg/dL (ref 8.9–10.3)
Chloride: 107 mmol/L (ref 98–111)
Creatinine, Ser: 0.85 mg/dL (ref 0.61–1.24)
GFR calc Af Amer: >60 mL/min (ref >60)
GFR calc non Af Amer: >60 mL/min (ref >60)
Glucose, Bld: 118 mg/dL — ABNORMAL HIGH (ref 70–99)
Potassium: 3.9 mmol/L (ref 3.5–5.1)
Sodium: 139 mmol/L (ref 135–145)

## 2018-06-18 LAB — PROTIME-INR
INR: 1 (ref 0.8–1.2)
Prothrombin Time: 12.9 seconds (ref 11.4–15.2)

## 2018-06-18 LAB — APTT: aPTT: 26 seconds (ref 24–36)

## 2018-06-23 MED ORDER — CEFAZOLIN SODIUM-DEXTROSE 2-4 GM/100ML-% IV SOLN
2.0000 g | INTRAVENOUS | Status: AC
  Administered 2018-06-24: 2 g via INTRAVENOUS

## 2018-06-24 ENCOUNTER — Ambulatory Visit: Payer: Commercial Managed Care - PPO | Admitting: Anesthesiology

## 2018-06-24 ENCOUNTER — Ambulatory Visit
Admission: RE | Admit: 2018-06-24 | Discharge: 2018-06-24 | Disposition: A | Discharge status: Home / Self Care | Payer: Commercial Managed Care - PPO | Attending: Orthopedic Surgery | Admitting: Orthopedic Surgery

## 2018-06-24 ENCOUNTER — Other Ambulatory Visit: Payer: Self-pay

## 2018-06-24 ENCOUNTER — Encounter
Admission: RE | Disposition: A | Discharge status: Home / Self Care | Payer: Self-pay | Source: Home / Self Care | Attending: Orthopedic Surgery

## 2018-06-24 DIAGNOSIS — F319 Bipolar disorder, unspecified: Secondary | ICD-10-CM | POA: Diagnosis not present

## 2018-06-24 DIAGNOSIS — Z8614 Personal history of Methicillin resistant Staphylococcus aureus infection: Secondary | ICD-10-CM | POA: Diagnosis not present

## 2018-06-24 DIAGNOSIS — K219 Gastro-esophageal reflux disease without esophagitis: Secondary | ICD-10-CM | POA: Insufficient documentation

## 2018-06-24 DIAGNOSIS — Z79899 Other long term (current) drug therapy: Secondary | ICD-10-CM | POA: Diagnosis not present

## 2018-06-24 DIAGNOSIS — G4730 Sleep apnea, unspecified: Secondary | ICD-10-CM | POA: Insufficient documentation

## 2018-06-24 DIAGNOSIS — I10 Essential (primary) hypertension: Secondary | ICD-10-CM | POA: Insufficient documentation

## 2018-06-24 DIAGNOSIS — M19011 Primary osteoarthritis, right shoulder: Secondary | ICD-10-CM | POA: Diagnosis not present

## 2018-06-24 DIAGNOSIS — Z7982 Long term (current) use of aspirin: Secondary | ICD-10-CM | POA: Insufficient documentation

## 2018-06-24 DIAGNOSIS — M75101 Unspecified rotator cuff tear or rupture of right shoulder, not specified as traumatic: Secondary | ICD-10-CM | POA: Diagnosis not present

## 2018-06-24 DIAGNOSIS — M75121 Complete rotator cuff tear or rupture of right shoulder, not specified as traumatic: Secondary | ICD-10-CM | POA: Diagnosis present

## 2018-06-24 DIAGNOSIS — F419 Anxiety disorder, unspecified: Secondary | ICD-10-CM | POA: Insufficient documentation

## 2018-06-24 DIAGNOSIS — Z85820 Personal history of malignant melanoma of skin: Secondary | ICD-10-CM | POA: Diagnosis not present

## 2018-06-24 DIAGNOSIS — M25811 Other specified joint disorders, right shoulder: Secondary | ICD-10-CM | POA: Insufficient documentation

## 2018-06-24 DIAGNOSIS — E7800 Pure hypercholesterolemia, unspecified: Secondary | ICD-10-CM | POA: Insufficient documentation

## 2018-06-24 DIAGNOSIS — M7701 Medial epicondylitis, right elbow: Secondary | ICD-10-CM | POA: Diagnosis not present

## 2018-06-24 HISTORY — PX: SHOULDER ARTHROSCOPY WITH OPEN ROTATOR CUFF REPAIR: SHX6092

## 2018-06-24 LAB — URINE DRUG SCREEN, QUALITATIVE (ARMC ONLY)
Amphetamines, Ur Screen: NOT DETECTED
Barbiturates, Ur Screen: NOT DETECTED
Benzodiazepine, Ur Scrn: NOT DETECTED
Cannabinoid 50 Ng, Ur ~~LOC~~: POSITIVE — AB
Cocaine Metabolite,Ur ~~LOC~~: NOT DETECTED
MDMA (Ecstasy)Ur Screen: NOT DETECTED
Methadone Scn, Ur: NOT DETECTED
Opiate, Ur Screen: NOT DETECTED
Phencyclidine (PCP) Ur S: NOT DETECTED
Tricyclic, Ur Screen: NOT DETECTED

## 2018-06-24 SURGERY — ARTHROSCOPY, SHOULDER WITH REPAIR, ROTATOR CUFF, OPEN
Anesthesia: Regional | Site: Shoulder | Laterality: Right

## 2018-06-24 MED ORDER — BUPIVACAINE LIPOSOME 1.3 % IJ SUSP
INTRAMUSCULAR | Status: DC | PRN
  Administered 2018-06-24 (×2): 10 mL

## 2018-06-24 MED ORDER — LIDOCAINE HCL (PF) 1 % IJ SOLN
INTRAMUSCULAR | Status: DC | PRN
  Administered 2018-06-24: 1 mL

## 2018-06-24 MED ORDER — EPINEPHRINE PF 1 MG/ML IJ SOLN
INTRAMUSCULAR | Status: DC | PRN
  Administered 2018-06-24: 10 mL

## 2018-06-24 MED ORDER — MIDAZOLAM HCL 2 MG/2ML IJ SOLN
INTRAMUSCULAR | Status: DC | PRN
  Administered 2018-06-24: 2 mg via INTRAVENOUS

## 2018-06-24 MED ORDER — MIDAZOLAM HCL 2 MG/2ML IJ SOLN
INTRAMUSCULAR | Status: AC
  Administered 2018-06-24: 1 mg via INTRAVENOUS
  Filled 2018-06-24: qty 2

## 2018-06-24 MED ORDER — BUPIVACAINE HCL (PF) 0.5 % IJ SOLN
INTRAMUSCULAR | Status: DC | PRN
  Administered 2018-06-24 (×2): 5 mL

## 2018-06-24 MED ORDER — FENTANYL CITRATE (PF) 100 MCG/2ML IJ SOLN
INTRAMUSCULAR | Status: AC
  Filled 2018-06-24: qty 2

## 2018-06-24 MED ORDER — BUPIVACAINE HCL (PF) 0.25 % IJ SOLN
INTRAMUSCULAR | Status: AC
  Filled 2018-06-24: qty 30

## 2018-06-24 MED ORDER — LIDOCAINE HCL (PF) 1 % IJ SOLN
INTRAMUSCULAR | Status: AC
  Filled 2018-06-24: qty 5

## 2018-06-24 MED ORDER — PROPOFOL 10 MG/ML IV BOLUS
INTRAVENOUS | Status: DC | PRN
  Administered 2018-06-24: 70 mg via INTRAVENOUS
  Administered 2018-06-24: 200 mg via INTRAVENOUS

## 2018-06-24 MED ORDER — MIDAZOLAM HCL 2 MG/2ML IJ SOLN
1.0000 mg | Freq: Once | INTRAMUSCULAR | Status: AC
  Administered 2018-06-24: 1 mg via INTRAVENOUS

## 2018-06-24 MED ORDER — FENTANYL CITRATE (PF) 100 MCG/2ML IJ SOLN
50.0000 ug | Freq: Once | INTRAMUSCULAR | Status: AC
  Administered 2018-06-24: 50 ug via INTRAVENOUS

## 2018-06-24 MED ORDER — BUPIVACAINE LIPOSOME 1.3 % IJ SUSP
INTRAMUSCULAR | Status: AC
  Filled 2018-06-24: qty 20

## 2018-06-24 MED ORDER — ROCURONIUM BROMIDE 100 MG/10ML IV SOLN
INTRAVENOUS | Status: DC | PRN
  Administered 2018-06-24: 50 mg via INTRAVENOUS

## 2018-06-24 MED ORDER — PROPOFOL 10 MG/ML IV BOLUS
INTRAVENOUS | Status: AC
  Filled 2018-06-24: qty 20

## 2018-06-24 MED ORDER — EPHEDRINE SULFATE 50 MG/ML IJ SOLN
INTRAMUSCULAR | Status: DC | PRN
  Administered 2018-06-24: 10 mg via INTRAVENOUS

## 2018-06-24 MED ORDER — NEOMYCIN-POLYMYXIN B GU 40-200000 IR SOLN
Status: DC | PRN
  Administered 2018-06-24: 2 mL

## 2018-06-24 MED ORDER — ONDANSETRON HCL 4 MG/2ML IJ SOLN
INTRAMUSCULAR | Status: DC | PRN
  Administered 2018-06-24: 4 mg via INTRAVENOUS

## 2018-06-24 MED ORDER — HYDROMORPHONE HCL 1 MG/ML IJ SOLN: 0.2500 mg | INTRAMUSCULAR | Status: DC | PRN

## 2018-06-24 MED ORDER — SUGAMMADEX SODIUM 200 MG/2ML IV SOLN
INTRAVENOUS | Status: DC | PRN
  Administered 2018-06-24: 200 mg via INTRAVENOUS

## 2018-06-24 MED ORDER — MIDAZOLAM HCL 2 MG/2ML IJ SOLN
INTRAMUSCULAR | Status: AC
  Filled 2018-06-24: qty 2

## 2018-06-24 MED ORDER — EPINEPHRINE 30 MG/30ML IJ SOLN
INTRAMUSCULAR | Status: AC
  Filled 2018-06-24: qty 1

## 2018-06-24 MED ORDER — FENTANYL CITRATE (PF) 100 MCG/2ML IJ SOLN
INTRAMUSCULAR | Status: AC
  Administered 2018-06-24: 50 ug via INTRAVENOUS
  Filled 2018-06-24: qty 2

## 2018-06-24 MED ORDER — CHLORHEXIDINE GLUCONATE CLOTH 2 % EX PADS: 6.0000 | MEDICATED_PAD | Freq: Once | CUTANEOUS | Status: DC

## 2018-06-24 MED ORDER — NEOMYCIN-POLYMYXIN B GU 40-200000 IR SOLN
Status: AC
  Filled 2018-06-24: qty 1

## 2018-06-24 MED ORDER — FENTANYL CITRATE (PF) 100 MCG/2ML IJ SOLN
INTRAMUSCULAR | Status: DC | PRN
  Administered 2018-06-24: 100 ug via INTRAVENOUS

## 2018-06-24 MED ORDER — LACTATED RINGERS IV SOLN
INTRAVENOUS | Status: DC
  Administered 2018-06-24: 13:00:00 via INTRAVENOUS

## 2018-06-24 MED ORDER — ONDANSETRON HCL 4 MG PO TABS: 4.0000 mg | ORAL_TABLET | Freq: Three times a day (TID) | ORAL | 0 refills | Status: DC | PRN

## 2018-06-24 MED ORDER — BETAMETHASONE SOD PHOS & ACET 6 (3-3) MG/ML IJ SUSP
6.0000 mg | Freq: Once | INTRAMUSCULAR | Status: AC
  Administered 2018-06-24: 6 mg via INTRA_ARTICULAR
  Filled 2018-06-24: qty 1

## 2018-06-24 MED ORDER — LIDOCAINE HCL (PF) 2 % IJ SOLN
INTRAMUSCULAR | Status: DC | PRN
  Administered 2018-06-24: 1 mL via PERINEURAL

## 2018-06-24 MED ORDER — CEFAZOLIN SODIUM-DEXTROSE 2-4 GM/100ML-% IV SOLN
INTRAVENOUS | Status: AC
  Filled 2018-06-24: qty 100

## 2018-06-24 MED ORDER — OXYCODONE HCL 5 MG PO TABS: 5.0000 mg | ORAL_TABLET | ORAL | 0 refills | Status: DC | PRN

## 2018-06-24 MED ORDER — BUPIVACAINE HCL (PF) 0.5 % IJ SOLN
INTRAMUSCULAR | Status: AC
  Filled 2018-06-24: qty 10

## 2018-06-24 SURGICAL SUPPLY — 74 items
ADAPTER IRRIG TUBE 2 SPIKE SOL (ADAPTER) ×4 IMPLANT
ANCHOR ALL-SUT Q-FIX 2.8 (Anchor) ×4 IMPLANT
ANCHOR SUT 5.5 MULTIFIX (Orthopedic Implant) ×4 IMPLANT
BUR RADIUS 4.0X18.5 (BURR) ×2 IMPLANT
BUR RADIUS 5.5 (BURR) ×2 IMPLANT
CANNULA 5.75X7 CRYSTAL CLEAR (CANNULA) ×4 IMPLANT
CANNULA PARTIAL THREAD 2X7 (CANNULA) ×2 IMPLANT
CANNULA TWIST IN 8.25X9CM (CANNULA) IMPLANT
CHLORAPREP W/TINT 26 (MISCELLANEOUS) ×8 IMPLANT
CONNECTOR PERFECT PASSER (CONNECTOR) ×2 IMPLANT
COOLER POLAR GLACIER W/PUMP (MISCELLANEOUS) ×2 IMPLANT
COVER WAND RF STERILE (DRAPES) ×2 IMPLANT
CRADLE LAMINECT ARM (MISCELLANEOUS) ×2 IMPLANT
DEVICE SUCT BLK HOLE OR FLOOR (MISCELLANEOUS) ×2 IMPLANT
DRAPE IMP U-DRAPE 54X76 (DRAPES) ×4 IMPLANT
DRAPE INCISE IOBAN 66X45 STRL (DRAPES) ×2 IMPLANT
DRAPE SHEET LG 3/4 BI-LAMINATE (DRAPES) ×2 IMPLANT
DRAPE U-SHAPE 47X51 STRL (DRAPES) IMPLANT
DURAPREP 26ML APPLICATOR (WOUND CARE) IMPLANT
ELECT REM PT RETURN 9FT ADLT (ELECTROSURGICAL) ×2
ELECTRODE REM PT RTRN 9FT ADLT (ELECTROSURGICAL) ×1 IMPLANT
GAUZE PETRO XEROFOAM 1X8 (MISCELLANEOUS) ×2 IMPLANT
GAUZE SPONGE 4X4 12PLY STRL (GAUZE/BANDAGES/DRESSINGS) ×4 IMPLANT
GLOVE BIOGEL PI IND STRL 7.5 (GLOVE) ×5 IMPLANT
GLOVE BIOGEL PI IND STRL 9 (GLOVE) ×1 IMPLANT
GLOVE BIOGEL PI INDICATOR 7.5 (GLOVE) ×5
GLOVE BIOGEL PI INDICATOR 9 (GLOVE) ×1
GLOVE SURG 9.0 ORTHO LTXF (GLOVE) ×4 IMPLANT
GOWN STRL REUS TWL 2XL XL LVL4 (GOWN DISPOSABLE) ×2 IMPLANT
GOWN STRL REUS W/ TWL LRG LVL3 (GOWN DISPOSABLE) ×1 IMPLANT
GOWN STRL REUS W/ TWL LRG LVL4 (GOWN DISPOSABLE) ×2 IMPLANT
GOWN STRL REUS W/TWL LRG LVL3 (GOWN DISPOSABLE) ×1
GOWN STRL REUS W/TWL LRG LVL4 (GOWN DISPOSABLE) ×2
IV LACTATED RINGER IRRG 3000ML (IV SOLUTION) ×11
IV LR IRRIG 3000ML ARTHROMATIC (IV SOLUTION) ×11 IMPLANT
KIT STABILIZATION SHOULDER (MISCELLANEOUS) ×2 IMPLANT
KIT SUTURE 2.8 Q-FIX DISP (MISCELLANEOUS) ×2 IMPLANT
KIT SUTURETAK 3.0 INSERT PERC (KITS) IMPLANT
KIT TURNOVER KIT A (KITS) ×2 IMPLANT
MANIFOLD NEPTUNE II (INSTRUMENTS) ×2 IMPLANT
MASK FACE SPIDER DISP (MASK) ×2 IMPLANT
MAT ABSORB  FLUID 56X50 GRAY (MISCELLANEOUS) ×2
MAT ABSORB FLUID 56X50 GRAY (MISCELLANEOUS) ×2 IMPLANT
NDL SAFETY ECLIPSE 18X1.5 (NEEDLE) ×2 IMPLANT
NEEDLE HYPO 18GX1.5 SHARP (NEEDLE) ×2
NEEDLE HYPO 22GX1.5 SAFETY (NEEDLE) ×4 IMPLANT
NS IRRIG 500ML POUR BTL (IV SOLUTION) ×2 IMPLANT
PACK ARTHROSCOPY SHOULDER (MISCELLANEOUS) ×2 IMPLANT
PAD WRAPON POLAR SHDR XLG (MISCELLANEOUS) ×1 IMPLANT
PASSER SUT CAPTURE FIRST (SUTURE) ×2 IMPLANT
SET TUBE SUCT SHAVER OUTFL 24K (TUBING) ×2 IMPLANT
SET TUBE TIP INTRA-ARTICULAR (MISCELLANEOUS) ×2 IMPLANT
STRAP SAFETY 5IN WIDE (MISCELLANEOUS) ×4 IMPLANT
STRIP CLOSURE SKIN 1/2X4 (GAUZE/BANDAGES/DRESSINGS) ×4 IMPLANT
SUT ETHILON 4-0 (SUTURE) ×1
SUT ETHILON 4-0 FS2 18XMFL BLK (SUTURE) ×1
SUT LASSO 90 DEG SD STR (SUTURE) IMPLANT
SUT MNCRL 4-0 (SUTURE) ×1
SUT MNCRL 4-0 27XMFL (SUTURE) ×1
SUT PDS AB 0 CT1 27 (SUTURE) ×2 IMPLANT
SUT PERFECTPASSER WHITE CART (SUTURE) ×4 IMPLANT
SUT SMART STITCH CARTRIDGE (SUTURE) ×2 IMPLANT
SUT VIC AB 0 CT1 36 (SUTURE) ×2 IMPLANT
SUT VIC AB 2-0 CT2 27 (SUTURE) ×2 IMPLANT
SUTURE ETHLN 4-0 FS2 18XMF BLK (SUTURE) ×1 IMPLANT
SUTURE MAGNUM WIRE 2X48 BLK (SUTURE) IMPLANT
SUTURE MNCRL 4-0 27XMF (SUTURE) ×1 IMPLANT
SYR 10ML LL (SYRINGE) ×2 IMPLANT
SYR 3ML 18GX1 1/2 (SYRINGE) ×4 IMPLANT
TAPE MICROFOAM 4IN (TAPE) ×2 IMPLANT
TUBING ARTHRO INFLOW-ONLY STRL (TUBING) ×2 IMPLANT
TUBING CONNECTING 10 (TUBING) ×2 IMPLANT
WAND HAND CNTRL MULTIVAC 90 (MISCELLANEOUS) ×4 IMPLANT
WRAPON POLAR PAD SHDR XLG (MISCELLANEOUS) ×2

## 2018-06-24 NOTE — Anesthesia Post-op Follow-up Note (Signed)
Anesthesia QCDR form completed.        

## 2018-06-24 NOTE — Anesthesia Procedure Notes (Signed)
Anesthesia Regional Block: Interscalene brachial plexus block   Pre-Anesthetic Checklist: ,, timeout performed, Correct Patient, Correct Site, Correct Laterality, Correct Procedure, Correct Position, site marked, Risks and benefits discussed,  Surgical consent,  Pre-op evaluation,  At surgeon's request and post-op pain management  Laterality: Right  Prep: chloraprep       Needles:   Needle Type: Stimiplex     Needle Length: 9cm  Needle Gauge: 21     Additional Needles:   Procedures:,,,, ultrasound used (permanent image in chart),,,,  Narrative:  Start time: 06/24/2018 11:58 AM End time: 06/24/2018 12:06 PM  Performed by: Personally  Anesthesiologist: Durenda Hurt, MD  Additional Notes: Negative aspiration.  Negative paresthesia on injection.  Dose given in divided aliquots under ultrasound guidance.

## 2018-06-24 NOTE — Transfer of Care (Signed)
Immediate Anesthesia Transfer of Care Note  Patient: Calvin Cole  Procedure(s) Performed: SHOULDER ARTHROSCOPY SUBACROMIAL DECOMPRESSION, DISTAL CLAVICLE EXCISION & MINI OPEN ROTATOR CUFF REPAIR-RIGHT. RIGHT ELBOW MEDIAL EPICONDYLITIS INJECTION. (Right Shoulder)  Patient Location: PACU  Anesthesia Type:General  Level of Consciousness: awake, alert  and oriented  Airway & Oxygen Therapy: Patient Spontanous Breathing and Patient connected to nasal cannula oxygen  Post-op Assessment: Report given to RN and Post -op Vital signs reviewed and stable  Post vital signs: Reviewed and stable  Last Vitals:  Vitals Value Taken Time  BP    Temp    Pulse    Resp    SpO2      Last Pain:  Vitals:   06/24/18 1041  TempSrc: Tympanic  PainSc: 3          Complications: No apparent anesthesia complications

## 2018-06-24 NOTE — Discharge Instructions (Signed)
Interscalene Nerve Block, Care After This sheet gives you information about how to care for yourself after your procedure. Your health care provider may also give you more specific instructions. If you have problems or questions, contact your health care provider. What can I expect after the procedure? After the procedure, it is common to have:  Soreness or tenderness in your neck.  Numbness in your shoulder, upper arm, and some fingers.  Weakness in your shoulder and arm muscles. The feeling and strength in your shoulder, arm, and fingers should return to normal within hours after your procedure. Follow these instructions at home: For at least 24 hours after the procedure:  Do not: ? Participate in activities in which you could fall or become injured. ? Drive. ? Use heavy machinery. ? Drink alcohol. ? Take sleeping pills or medicines that cause drowsiness. ? Make important decisions or sign legal documents. ? Take care of children on your own.  Rest. Eating and drinking  If you vomit, drink water, juice, or soup when you can drink without vomiting.  Make sure you have little or no nausea before eating solid foods.  Follow the diet that is recommended by your health care provider. If you have a sling:  Wear it as told by your health care provider. Remove it only as told by your health care provider.  Loosen the sling if your fingers tingle, become numb, or turn cold and blue.  Make sure that your entire arm, including your wrist, is supported. Do not allow your wrist to dangle over the end of the sling.  Do not let your sling get wet if it is not waterproof.  Keep the sling clean. Bathing  Do not take baths, swim, or use a hot tub until your health care provider approves.  If you have a nerve block catheter in place, keep the incision site and tubing dry. Injection site care   Wash your hands with soap and water before you change your bandage (dressing). If soap and  water are not available, use hand sanitizer.  Change your dressing as told by your health care provider.  Keep your dressing dry.  Check your nerve block injection site every day for signs of infection. Check for: ? Redness, swelling, or pain. ? Fluid or blood. ? Warmth. Activity  Do not perform complex or risky activities while taking prescription pain medicine and until you have fully recovered.  Return to your normal activities as told by your health care provider and as you can tolerate them. Ask your health care provider what activities are safe for you.  Rest and take it easy. This will help you heal and recover more quickly and fully.  Be very cautious until you have regained strength and sensation. General instructions  Have a responsible adult stay with you until you are awake and alert.  Do not drive or use heavy machinery while taking prescription pain medicine and until you have fully recovered. Ask your health care provider when it is safe to drive.  Take over-the-counter and prescription medicines only as told by your health care provider.  If you smoke, do not smoke without supervision.  Do not expose your arm or shoulder to very cold or very hot temperatures until you have full feeling back.  If you have a nerve block catheter in place: ? Try to keep the catheter from getting kinked or pinched. ? Avoid pulling or tugging on the catheter.  Keep all follow-up visits as told  by your health care provider. This is important. Contact a health care provider if:  You have chills or fever.  You have redness, swelling, or pain around your injection site.  You have fluid or blood coming from the injection site.  The skin around the injection site is warm to the touch.  There is a bad smell coming from your dressing.  You have hoarseness or a drooping or dry eye that lasts more than a few days.  You have pain that is poorly controlled with the block or with pain  medicine.  You have numbness, tingling, or weakness in your shoulder or arm that lasts for more than one week. Get help right away if:  You have severe pain.  You lose or do not regain strength and sensation in your arm even after the nerve block medicine has stopped.  You have trouble breathing.  You have a nerve block catheter still in place and you begin to shiver.  You have a nerve block catheter still in place and you are getting more and more numb or weak. This information is not intended to replace advice given to you by your health care provider. Make sure you discuss any questions you have with your health care provider. Document Released: 03/25/2015 Document Revised: 12/02/2015 Document Reviewed: 12/02/2015 Elsevier Interactive Patient Education  2019 [place]   1) The drugs that you were given will stay in your system until tomorrow so for the next 24 hours you should not:  A) Drive an automobile B) Make any legal decisions C) Drink any alcoholic beverage   2) You may resume regular meals tomorrow.  Today it is better to start with liquids and gradually work up to solid foods.  You may eat anything you prefer, but it is better to start with liquids, then soup and crackers, and gradually work up to solid foods.   3) Please notify your doctor immediately if you have any unusual bleeding, trouble breathing, redness and pain at the surgery site, drainage, fever, or pain not relieved by medication.   4) Additional Instructions:   Please contact your physician with any problems or Same Day Surgery at [phone], Monday through Friday 6 am to 4 pm, or [place] at [facility] number at [phone].
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
## 2018-06-24 NOTE — Op Note (Signed)
06/24/2018  3:44 PM  PATIENT:  Calvin Cole  63 y.o. male  PRE-OPERATIVE DIAGNOSIS:  RIGHT HIGH GRADE PARTIAL THICKNESS ROTATOR CUFF TEAR WITH SUBACROMIAL IMPINGEMENT AND ACROMIOCLAVICULAR JOINT ARTHROSIS.  RIGHT ELBOW MEDIAL EPICONDYLITIS  POST-OPERATIVE DIAGNOSIS:  SAME with arthroscopic debridement of the labrum  PROCEDURE:  Procedure(s): 1.  RIGHT SHOULDER ARTHROSCOPIC SUBACROMIAL DECOMPRESSION, DISTAL CLAVICLE EXCISION, DEBRIDEMENT OF THE LABRUM & MINI OPEN ROTATOR CUFF REPAIR.   2.  RIGHT ELBOW MEDIAL EPICONDYLITIS INJECTION.   SURGEON:  Surgeon(s) and Role:    * Thornton Park, MD - Primary  ANESTHESIA:   general and paracervical block   PREOPERATIVE INDICATIONS:  Calvin Cole is a  63 y.o. male with a diagnosis of RIGHT HIGH GRADE PARTIAL THICKNESS ROTATOR CUFF TEAR failed conservative treatment and elected for surgical management.  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  He also complains of right medial epicondylitis today and requests a steroid injection during surgery.  The risks benefits and alternatives were discussed with the patient preoperatively including but not limited to the risks of infection, bleeding, nerve injury, persistent pain or weakness, failure of the hardware, re-tear of the rotator cuff and the need for further surgery. Medical risks include DVT and pulmonary embolism, myocardial infarction, stroke, pneumonia, respiratory failure and death. Patient understood these risks and wished to proceed.  OPERATIVE IMPLANTS:Smith [employer] Nephew Multifix anchors x 2 & [employer] Q Fix anchors x 2  OPERATIVE FINDINGS: Patient's biceps tendon was intact.  There was fraying of the posterior, superior and anterior labrum.  There was no detachment of the labrum from the glenoid.   Patient had a high-grade partial-thickness tear involving the supraspinatus.  There were no focal chondral lesions.   The subscapularis was intact.  OPERATIVE PROCEDURE: The patient was met in the preoperative area. The right shoulder was signed with the word yes and my initials according the hospital's correct site of surgery protocol.   A preop history and physical was performed at the bedside.  Patient underwent an interscalene block by the anesthesia service in the preoperative area.  Patient was brought to the operating room where he underwent general endotracheal intubation.  The patient was placed in a beachchair position.  A spider arm positioner was used for this case. Examination under anesthesia revealed no limitation of motion or instability with load shift testing. The patient had a negative sulcus sign.  Patient was prepped and draped in a sterile fashion. A timeout was performed to verify the patient's name, date of birth, medical record number, correct site of surgery and correct procedure to be performed there was also used to verify the patient received antibiotics that all appropriate instruments, implants and radiographs studies were available in the room. Once all in attendance were in agreement case began.  Bony landmarks were drawn out with a surgical marker along with proposed arthroscopy incisions. These were pre-injected with 1% lidocaine plain. An 11 blade was used to establish a posterior portal through which the arthroscope was placed in the glenohumeral joint. A full diagnostic examination of the shoulder was performed.  Patient was found to have a high-grade partial-thickness tear of the supraspinatus. An 18-gauge spinal needle was used to place a 0 PDS suture through the rotator cuff tear for identification from the bursal side.  The arthroscope was then placed in the subacromial space.  Extensive bursitis was encountered and debrided using a  4-0 resector shaver blade and a 90 ArthroCare wand from a lateral portal which was established under direct visualization using an 18-gauge spinal  needle. A subacromial decompression was also performed using a 5.5 mm resector shaver blade from the lateral portal.  The 0 PDS suture was identified.  The partial-thickness tear was then completed with a 4.0 resector shaver blade.  Two Perfect Pass suture were placed in the lateral border of the rotator cuff tear. All arthroscopic instruments were then removed and the mini-open portion of the procedure began.   A saber-type incision was made along the lateral border of the acromion. The deltoid muscle was identified and split in line with its fibers which allowed visualization of the rotator cuff. The Perfect Pass sutures previously placed in the lateral border of the rotator cuff were brought out through the deltoid split.  A third Perfect Pass Suture was place in the lateral border of the rotator cuff tear.  A 5.5 mm resector shaver blade was then used to debride the greater tuberosity of all torn fibers of the rotator cuff.  Two [employer] anchor was placed at the articular margin of the humeral head with the greater tuberosity. The suture limbs of the Q Fix anchor were passed medially through the rotator cuff using a First Pass suture passer.   The three Perfect Pass sutures were then anchored to the greater tuberosity footprint using two [employer] Multifix anchors. These anchors were tensioned to allow for anatomic reduction of the rotator cuff to the greater tuberosity. The medial row sutures were then tied down using an arthroscopic knot tying technique.  Arthroscopic images of the repair were taken with the arthroscope both externally and from inside the glenohumeral joint.  All incisions were copiously irrigated. The deltoid fascia was repaired using a 0 Vicryl suture.  The subcutaneous tissue of all incisions were closed with a 2-0 Vicryl. Skin closure for the arthroscopic incisions was performed with 4-0 nylon. The skin edges of the saber incision was approximated with a running 4-0  undyed Monocryl.  A dry sterile dressing was applied.  The patient was placed in an abduction sling and a Polar Care was applied to the shoulder.  After the drapes were removed, the patient's medial right elbow was prepped with ChloraPrep.  The medial epicondyle was palpated.  A 22-gauge needle was used to inject 6 mg of Celestone and 2 cc of 1% lidocaine plain into the area of the common flexor tendon.  The injection site was cleaned with alcohol and a Band-Aid was applied.  All sharp and it instrument counts were correct at the conclusion of the case. I was scrubbed and present for the entire case. I spoke with the patient's fianc postoperatively to let her know the case was performed without complication and the patient was stable in recovery room.

## 2018-06-24 NOTE — Anesthesia Procedure Notes (Signed)
Procedure Name: Intubation Date/Time: 06/24/2018 12:51 PM Performed by: Chanetta Marshall, CRNA Pre-anesthesia Checklist: Patient identified, Emergency Drugs available, Suction available and Patient being monitored Patient Re-evaluated:Patient Re-evaluated prior to induction Oxygen Delivery Method: Circle system utilized Preoxygenation: Pre-oxygenation with 100% oxygen Induction Type: IV induction Ventilation: Mask ventilation without difficulty Laryngoscope Size: McGraph and 4 (Pt w/ prexisting chipped front teeth, large neck, and signficant facial obesity, therefore decision made to utilize McGrath to facilitate gentle laryngoscopy) Grade View: Grade II Tube type: Oral Tube size: 7.5 mm Number of attempts: 1 Airway Equipment and Method: Stylet and Video-laryngoscopy Secured at: 22 cm Tube secured with: Tape Dental Injury: Teeth and Oropharynx as per pre-operative assessment
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
## 2018-06-27 NOTE — Anesthesia Postprocedure Evaluation (Signed)
Anesthesia Post Note  Patient: Calvin Cole  Procedure(s) Performed: SHOULDER ARTHROSCOPY SUBACROMIAL DECOMPRESSION, DISTAL CLAVICLE EXCISION & MINI OPEN ROTATOR CUFF REPAIR-RIGHT. RIGHT ELBOW MEDIAL EPICONDYLITIS INJECTION. (Right Shoulder)  Patient location during evaluation: PACU Anesthesia Type: Regional Level of consciousness: awake and alert Pain management: pain level controlled Vital Signs Assessment: post-procedure vital signs reviewed and stable Respiratory status: spontaneous breathing, nonlabored ventilation, respiratory function stable and patient connected to nasal cannula oxygen Cardiovascular status: blood pressure returned to baseline and stable Postop Assessment: no apparent nausea or vomiting Anesthetic complications: no     Last Vitals:  Vitals:   06/24/18 1624 06/24/18 1711  BP: (!) 141/79 (!) 144/96  Pulse: 80 81  Resp: 16 16  Temp: (!) 36.3 C   SpO2: 93% 95%    Last Pain:  Vitals:   06/24/18 1711  TempSrc:   PainSc: 0-No pain                 Molli Barrows
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
## 2018-08-20 ENCOUNTER — Encounter (HOSPITAL_COMMUNITY): Payer: Self-pay | Admitting: Psychiatry

## 2018-08-20 ENCOUNTER — Other Ambulatory Visit: Payer: Self-pay

## 2018-08-20 ENCOUNTER — Ambulatory Visit (INDEPENDENT_AMBULATORY_CARE_PROVIDER_SITE_OTHER): Payer: Commercial Managed Care - PPO | Admitting: Psychiatry

## 2018-08-20 DIAGNOSIS — F319 Bipolar disorder, unspecified: Secondary | ICD-10-CM

## 2018-08-20 MED ORDER — BUPROPION HCL ER (XL) 150 MG PO TB24: 150.0000 mg | ORAL_TABLET | Freq: Every day | ORAL | 0 refills | Status: DC

## 2018-08-20 MED ORDER — ESCITALOPRAM OXALATE 20 MG PO TABS: 20.0000 mg | ORAL_TABLET | Freq: Every day | ORAL | 0 refills | Status: DC

## 2018-08-20 MED ORDER — CARBAMAZEPINE 200 MG PO TABS: 600.0000 mg | ORAL_TABLET | Freq: Two times a day (BID) | ORAL | 0 refills | Status: DC

## 2018-08-20 NOTE — Patient Instructions (Signed)
1. Continue carbamazepine 600 mg twice a day 2.ContinueBupropion 150 mg daily 3. ContinueLexapro20 mg daily 4. Continue ativan 0.5 mg daily as needed for anxiety 5. Next appointment: 8/3 at 8:40 for 20 mins

## 2018-10-06 ENCOUNTER — Telehealth (HOSPITAL_COMMUNITY): Payer: Self-pay | Admitting: *Deleted

## 2018-10-06 NOTE — Telephone Encounter (Signed)
DR HISADA PATIENT CALLED & STATED THAT HE RECEIVED A DENIED CLAIM FROM HIS INSURANCE COMPANY DUE TO THE FACT THE BILL DATED  08/20/2018 HAD BILLING CODE OF MEDICAL EXAM  & HE HAD A VIRTUAL VISIT WITH YOU ON THAT DAY. HE ASKED FOR CLAIM TO BE RESUBMITTED WITH CORRECT BILLING CODE. GAVE BILLING OFFICE # [phone] AFTER CHECKING WITH FRONT OFFICE DONNA

## 2018-10-06 NOTE — Telephone Encounter (Signed)
I believe I put correct code (video visit) in my note. I agree that he contact that number for further inquiry.
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
## 2018-11-17 ENCOUNTER — Ambulatory Visit (INDEPENDENT_AMBULATORY_CARE_PROVIDER_SITE_OTHER): Payer: Commercial Managed Care - PPO | Admitting: Psychiatry

## 2018-11-17 ENCOUNTER — Encounter (HOSPITAL_COMMUNITY): Payer: Self-pay | Admitting: Psychiatry

## 2018-11-17 ENCOUNTER — Other Ambulatory Visit: Payer: Self-pay

## 2018-11-17 DIAGNOSIS — F502 Bulimia nervosa: Secondary | ICD-10-CM

## 2018-11-17 DIAGNOSIS — F319 Bipolar disorder, unspecified: Secondary | ICD-10-CM

## 2018-11-17 MED ORDER — ESCITALOPRAM OXALATE 10 MG PO TABS: 10.0000 mg | ORAL_TABLET | Freq: Every day | ORAL | 0 refills | Status: DC

## 2018-11-17 MED ORDER — CARBAMAZEPINE 200 MG PO TABS: 600.0000 mg | ORAL_TABLET | Freq: Two times a day (BID) | ORAL | 0 refills | Status: DC

## 2018-11-17 MED ORDER — BUPROPION HCL ER (XL) 150 MG PO TB24: 150.0000 mg | ORAL_TABLET | Freq: Every day | ORAL | 0 refills | Status: DC

## 2018-11-17 NOTE — Patient Instructions (Signed)
1. Continue carbamazepine 600 mg twice a day 2.ContinueBupropion150 mg daily 3. Decrease Lexapro10 mg daily 4. Continue ativan 0.5 mg daily as needed for anxiety 5.Next appointment: 11/3 at 8:40

## 2018-12-29 ENCOUNTER — Telehealth (HOSPITAL_COMMUNITY): Payer: Self-pay | Admitting: Psychiatry

## 2018-12-29 NOTE — Telephone Encounter (Signed)
Received labs, faxed from PCP. Carbamazepine 12.4 on 12/26/2018. No other blood test (such as CBC) is included. Will plan to continue current dose of carbamazepine, and discuss with the patient at the next visit to monitor other parameters.
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
## 2019-02-17 ENCOUNTER — Other Ambulatory Visit: Payer: Self-pay

## 2019-02-17 ENCOUNTER — Encounter (HOSPITAL_COMMUNITY): Payer: Self-pay | Admitting: Psychiatry

## 2019-02-17 ENCOUNTER — Ambulatory Visit (INDEPENDENT_AMBULATORY_CARE_PROVIDER_SITE_OTHER): Payer: Commercial Managed Care - PPO | Admitting: Psychiatry

## 2019-02-17 DIAGNOSIS — F319 Bipolar disorder, unspecified: Secondary | ICD-10-CM | POA: Diagnosis not present

## 2019-02-17 MED ORDER — BUPROPION HCL ER (XL) 150 MG PO TB24: 150.0000 mg | ORAL_TABLET | Freq: Every day | ORAL | 1 refills | Status: DC

## 2019-02-17 MED ORDER — ESCITALOPRAM OXALATE 10 MG PO TABS: 10.0000 mg | ORAL_TABLET | Freq: Every day | ORAL | 1 refills | Status: DC

## 2019-02-17 MED ORDER — CARBAMAZEPINE 200 MG PO TABS: ORAL_TABLET | ORAL | 1 refills | Status: DC

## 2019-02-17 NOTE — Patient Instructions (Addendum)
1. Decrease carbamazepine 600 mg AM, 400 mg PM 2. ContinueBupropion150 mg daily 3. Continue Lexapro10 mg daily 4. Continue ativan 0.5 mg daily as needed for anxiety 5.Next appointment: in January 6. Obtain labs (CBC) with your primary care doctor

## 2019-02-27 ENCOUNTER — Other Ambulatory Visit: Payer: Self-pay | Admitting: *Deleted

## 2019-02-27 DIAGNOSIS — Z20822 Contact with and (suspected) exposure to covid-19: Secondary | ICD-10-CM

## 2019-03-01 ENCOUNTER — Telehealth: Payer: Self-pay

## 2019-03-01 NOTE — Telephone Encounter (Signed)
Received call from patient checking Covid results.  Advised no results at this time.   

## 2019-03-02 LAB — NOVEL CORONAVIRUS, NAA: SARS-CoV-2, NAA: NOT DETECTED

## 2019-05-04 ENCOUNTER — Ambulatory Visit (HOSPITAL_COMMUNITY): Payer: Commercial Managed Care - PPO | Admitting: Psychiatry

## 2019-05-21 ENCOUNTER — Other Ambulatory Visit: Payer: Self-pay

## 2019-05-21 ENCOUNTER — Ambulatory Visit (INDEPENDENT_AMBULATORY_CARE_PROVIDER_SITE_OTHER): Payer: Commercial Managed Care - PPO | Admitting: Psychiatry

## 2019-05-21 ENCOUNTER — Encounter: Payer: Self-pay | Admitting: Psychiatry

## 2019-05-21 DIAGNOSIS — F319 Bipolar disorder, unspecified: Secondary | ICD-10-CM | POA: Diagnosis not present

## 2019-05-21 DIAGNOSIS — F502 Bulimia nervosa: Secondary | ICD-10-CM | POA: Diagnosis not present

## 2019-05-21 MED ORDER — CARBAMAZEPINE 200 MG PO TABS: ORAL_TABLET | ORAL | 0 refills | Status: DC

## 2019-05-21 MED ORDER — BUPROPION HCL ER (XL) 150 MG PO TB24: 150.0000 mg | ORAL_TABLET | Freq: Every day | ORAL | 0 refills | Status: DC

## 2019-05-21 MED ORDER — LORAZEPAM 0.5 MG PO TABS: 0.5000 mg | ORAL_TABLET | Freq: Every day | ORAL | 0 refills | Status: DC | PRN

## 2019-05-21 MED ORDER — ESCITALOPRAM OXALATE 10 MG PO TABS: 10.0000 mg | ORAL_TABLET | Freq: Every day | ORAL | 0 refills | Status: DC
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
## 2019-08-19 ENCOUNTER — Telehealth (INDEPENDENT_AMBULATORY_CARE_PROVIDER_SITE_OTHER): Payer: Commercial Managed Care - PPO | Admitting: Psychiatry

## 2019-08-19 ENCOUNTER — Encounter (HOSPITAL_COMMUNITY): Payer: Self-pay | Admitting: Psychiatry

## 2019-08-19 ENCOUNTER — Other Ambulatory Visit: Payer: Self-pay

## 2019-08-19 DIAGNOSIS — F319 Bipolar disorder, unspecified: Secondary | ICD-10-CM | POA: Diagnosis not present

## 2019-08-19 MED ORDER — CARBAMAZEPINE 200 MG PO TABS: ORAL_TABLET | ORAL | 0 refills | Status: DC

## 2019-08-19 MED ORDER — BUPROPION HCL ER (XL) 150 MG PO TB24: 150.0000 mg | ORAL_TABLET | Freq: Every day | ORAL | 0 refills | Status: DC

## 2019-08-19 NOTE — Patient Instructions (Signed)
1. Continue carbamazepine 600 mg AM, 400 mg PM  2. ContinueBupropion150 mg daily 3.Decrease Lexapro 5 mg daily for one week, then discontinue 4. Continue ativan 0.5 mg daily as needed for anxiety 5.Next appointment:7/13 at 8:30

## 2019-09-16 ENCOUNTER — Telehealth (HOSPITAL_COMMUNITY): Payer: Self-pay | Admitting: *Deleted

## 2019-09-16 NOTE — Telephone Encounter (Signed)
Patient called and [facility] stating he is not feeling well and might need to go back on Lexapro that was discontinued. Staff called patient back and LMOM to call office back to get further details. Office number was provided on voicemail.

## 2019-10-14 IMAGING — MR MR SHOULDER*R* W/CM
6 series · 40 of 40 positions shown · IV contrast (agent unspecified)
Comparison: None.

CLINICAL DATA: Shoulder pain for 1 year.

EXAM:
MR ARTHROGRAM OF THE right SHOULDER
TECHNIQUE: Multiplanar, multisequence MR imaging of the right shoulder was
performed following the administration of intra-articular contrast.
CONTRAST:  See Injection Documentation.

[Series 5: T1 fat-sat · axial · right · 4.0mm · 0.55mm/px · z∈[-31,+89]mm · 6 of 25 slices shown (1 of 3)]
[im 1/25]
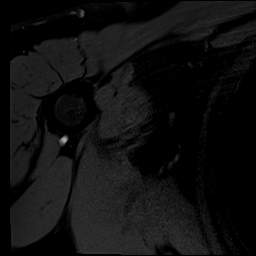
[im 5/25]
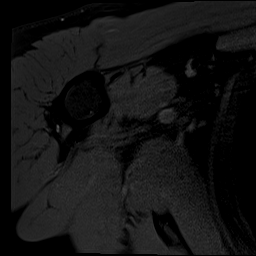
[im 10/25]
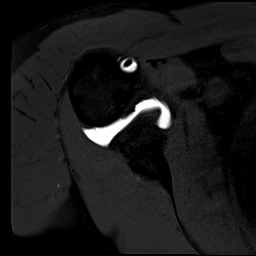
[im 15/25]
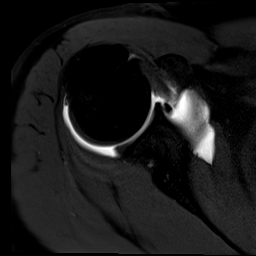
[im 20/25]
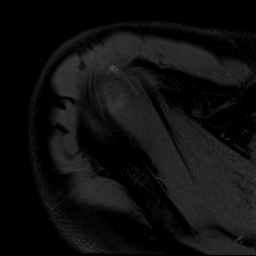
[im 25/25]
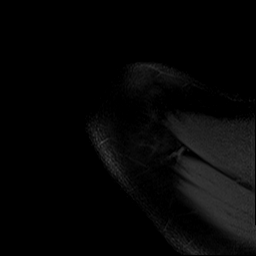

[Series 6: T1 fat-sat · oblique · right · 4.0mm · 0.55mm/px · 6 of 25 slices shown (2 of 3)]
[im 1/25]
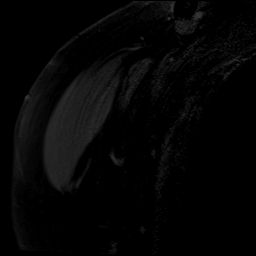
[im 5/25]
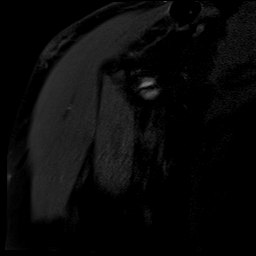
[im 10/25]
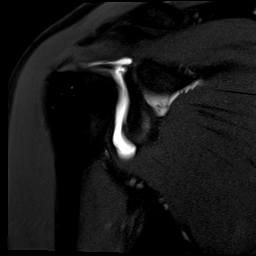
[im 15/25]
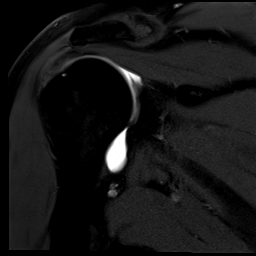
[im 20/25]
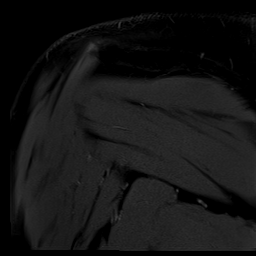
[im 25/25]
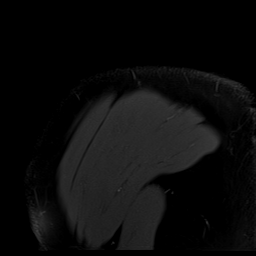

[Series 7: T2 fat-sat · oblique · right · 4.0mm · 0.55mm/px · 7 of 26 slices shown (1 of 2)]
[im 1/26]
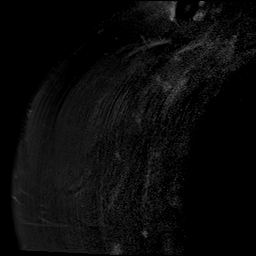
[im 5/26]
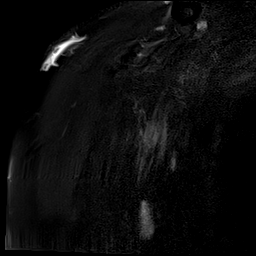
[im 9/26]
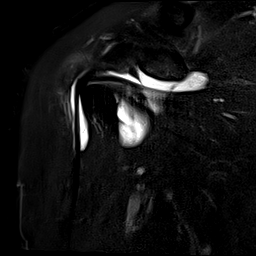
[im 13/26]
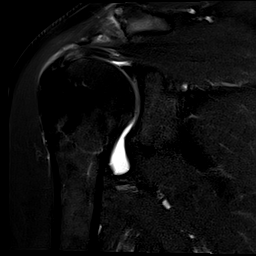
[im 17/26]
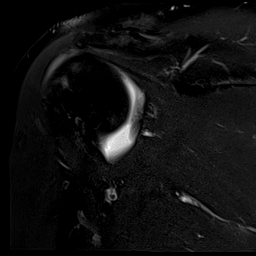
[im 21/26]
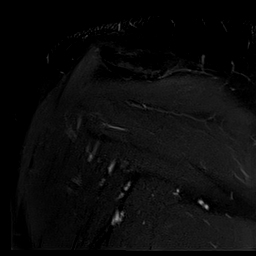
[im 26/26]
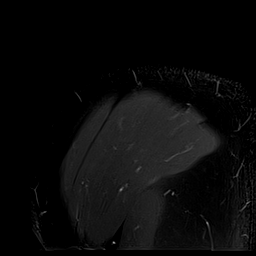

[Series 8: T1 · oblique · right · 4.0mm · 0.51mm/px · 7 of 26 slices shown]
[im 1/26]
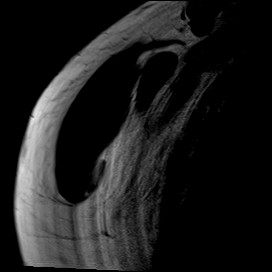
[im 5/26]
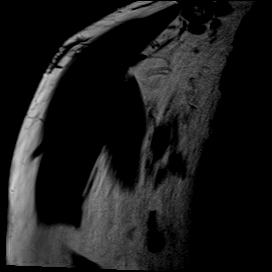
[im 9/26]
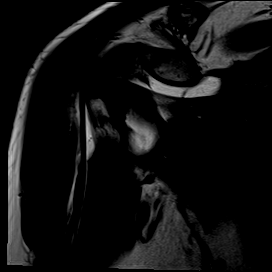
[im 13/26]
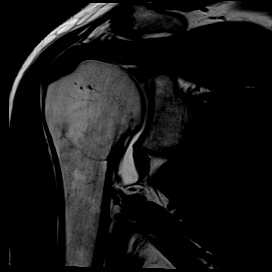
[im 17/26]
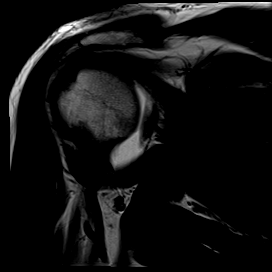
[im 21/26]
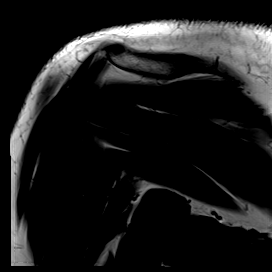
[im 26/26]
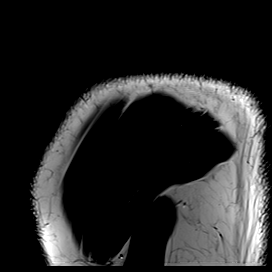

[Series 9: T2 fat-sat · coronal · right · 4.0mm · 0.55mm/px · 7 of 25 slices shown (2 of 2)]
[im 1/25]
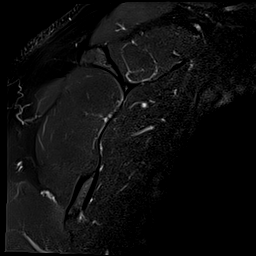
[im 5/25]
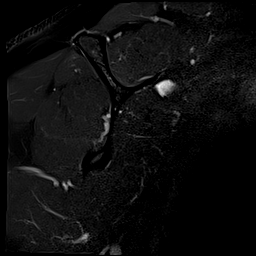
[im 9/25]
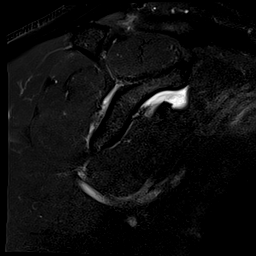
[im 13/25]
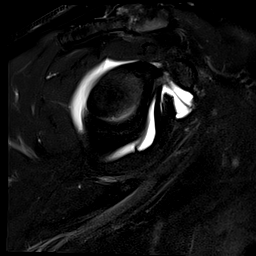
[im 17/25]
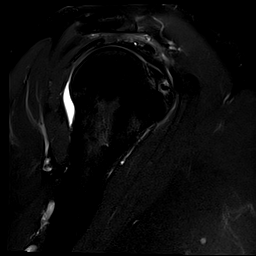
[im 21/25]
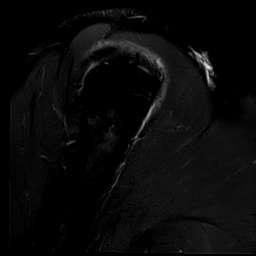
[im 25/25]
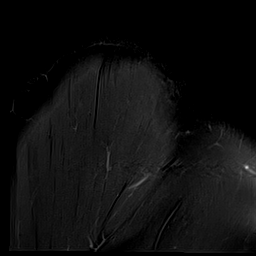

[Series 12: T1 fat-sat · sagittal · right · 4.0mm · 0.62mm/px · 7 of 26 slices shown (3 of 3)]
[im 1/26]
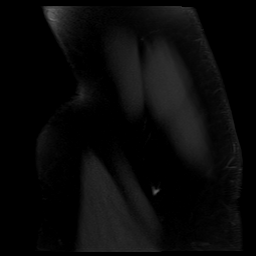
[im 5/26]
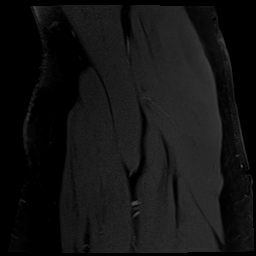
[im 9/26]
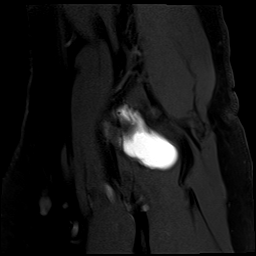
[im 13/26]
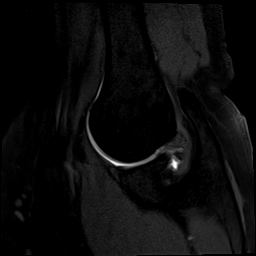
[im 17/26]
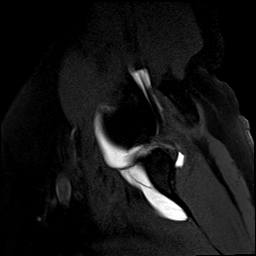
[im 21/26]
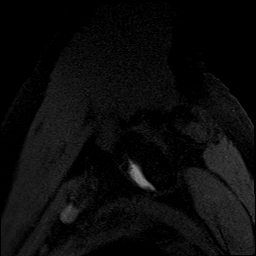
[im 26/26]
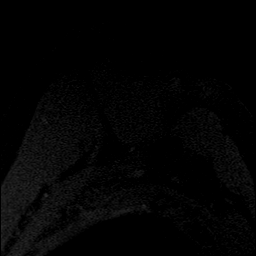

[40 of 40 positions shown; findings below may reference images not displayed]

FINDINGS: Rotator cuff: Significant supraspinatus tendinopathy/tendinosis.
There is an oblique coursing articular surface tear extending into a
intrasubstance tear all the way back to the musculotendinous
junction region. Moderate surrounding tendinopathy. No
full-thickness retracted tear. The infraspinatus and subscapularis
tendons are intact.

Muscles: Normal

Biceps long head: Intact

Acromioclavicular Joint: Moderate to advanced degenerative changes.
The acromion is type 2 in shape. No significant lateral downsloping
or undersurface spurring.

Glenohumeral Joint: Mild degenerative changes but no cartilage
defects or osteochondral lesion. No findings to suggest adhesive
capsulitis. No loose bodies are identified.

Labrum: The glenoid labrum and glenohumeral ligaments are intact.
The anterior labrocapsular complex is intact.

Bones: No acute bony findings.
IMPRESSION: 1. Significant supraspinatus tendinopathy/tendinosis with a fairly
extensive oblique coursing intrasubstance tear contacting the
articular surface near the anterior footprint attachment region.
Moderate surrounding tendinopathy. No full-thickness retracted tear.
2. Intact long head biceps tendon, glenoid labrum, glenohumeral
ligaments and anterior labrocapsular complex.
3. Moderate to advanced AC joint degenerative changes.
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
## 2019-10-27 ENCOUNTER — Other Ambulatory Visit: Payer: Self-pay

## 2019-10-27 ENCOUNTER — Telehealth (HOSPITAL_COMMUNITY): Payer: Commercial Managed Care - PPO | Admitting: Psychiatry

## 2019-11-03 ENCOUNTER — Other Ambulatory Visit (HOSPITAL_COMMUNITY): Payer: Self-pay | Admitting: Psychiatry

## 2019-11-03 ENCOUNTER — Telehealth (HOSPITAL_COMMUNITY): Payer: Self-pay | Admitting: *Deleted

## 2019-11-03 ENCOUNTER — Other Ambulatory Visit: Payer: Self-pay | Admitting: Psychiatry

## 2019-11-03 DIAGNOSIS — F319 Bipolar disorder, unspecified: Secondary | ICD-10-CM

## 2019-11-03 MED ORDER — LORAZEPAM 0.5 MG PO TABS: 0.5000 mg | ORAL_TABLET | Freq: Every day | ORAL | 0 refills | Status: DC | PRN

## 2019-11-03 NOTE — Telephone Encounter (Signed)
Patient called and [facility] stating he is not feeling well and might need to go back on Lexapro that was discontinued. Staff called patient back and LMOM to call office back to get further details. Office number was provided on voicemail.  Per pt he is back taking the Lexapro and its going well

## 2019-11-03 NOTE — Telephone Encounter (Signed)
Noted, please advise him to have sooner appointment if any worsening in his symptoms again.

## 2019-11-03 NOTE — Telephone Encounter (Signed)
Ordered

## 2019-11-03 NOTE — Telephone Encounter (Signed)
I have utilized the [place] Controlled Substances Reporting System (PMP AWARxE) to confirm adherence regarding the patient's medication. My review reveals appropriate prescription fills.  

## 2019-11-03 NOTE — Telephone Encounter (Signed)
Patient calling stating he would like refills for his Ativan
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
## 2019-11-24 ENCOUNTER — Telehealth (INDEPENDENT_AMBULATORY_CARE_PROVIDER_SITE_OTHER): Payer: Commercial Managed Care - PPO | Admitting: Psychiatry

## 2019-11-24 ENCOUNTER — Other Ambulatory Visit: Payer: Self-pay

## 2019-11-24 ENCOUNTER — Encounter (HOSPITAL_COMMUNITY): Payer: Self-pay | Admitting: Psychiatry

## 2019-11-24 DIAGNOSIS — F411 Generalized anxiety disorder: Secondary | ICD-10-CM | POA: Diagnosis not present

## 2019-11-24 DIAGNOSIS — F319 Bipolar disorder, unspecified: Secondary | ICD-10-CM

## 2019-11-24 MED ORDER — ESCITALOPRAM OXALATE 10 MG PO TABS: 10.0000 mg | ORAL_TABLET | Freq: Every day | ORAL | 0 refills | Status: DC

## 2019-11-24 MED ORDER — CARBAMAZEPINE 200 MG PO TABS: ORAL_TABLET | ORAL | 0 refills | Status: DC

## 2019-11-24 MED ORDER — LORAZEPAM 0.5 MG PO TABS: 0.5000 mg | ORAL_TABLET | Freq: Every day | ORAL | 2 refills | Status: DC | PRN

## 2019-11-24 NOTE — Patient Instructions (Signed)
1. Continue carbamazepine 600 mg AM, 400 mg PM 2.Discontinue bupropion 3.Continue lexapro 10 mg daily  4. Continue ativan 0.5 mg daily as needed for anxiety 5.Next appointment:11/2 at 8:30

## 2019-12-17 ENCOUNTER — Ambulatory Visit (INDEPENDENT_AMBULATORY_CARE_PROVIDER_SITE_OTHER): Payer: Commercial Managed Care - PPO | Admitting: Orthopaedic Surgery

## 2019-12-17 ENCOUNTER — Other Ambulatory Visit: Payer: Self-pay

## 2019-12-17 ENCOUNTER — Ambulatory Visit: Payer: Self-pay

## 2019-12-17 ENCOUNTER — Encounter: Payer: Self-pay | Admitting: Orthopaedic Surgery

## 2019-12-17 VITALS — BP 132/87 | Ht 73.0 in | Wt 265.0 lb

## 2019-12-17 DIAGNOSIS — M25552 Pain in left hip: Secondary | ICD-10-CM | POA: Diagnosis not present

## 2019-12-17 DIAGNOSIS — M25551 Pain in right hip: Secondary | ICD-10-CM
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
## 2020-01-21 ENCOUNTER — Ambulatory Visit: Payer: Commercial Managed Care - PPO | Admitting: Orthopaedic Surgery
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
## 2020-02-16 ENCOUNTER — Telehealth (HOSPITAL_COMMUNITY): Payer: Commercial Managed Care - PPO | Admitting: Psychiatry
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
## 2020-05-10 ENCOUNTER — Telehealth (INDEPENDENT_AMBULATORY_CARE_PROVIDER_SITE_OTHER): Payer: Commercial Managed Care - PPO | Admitting: Psychiatry

## 2020-05-10 ENCOUNTER — Encounter: Payer: Self-pay | Admitting: Psychiatry

## 2020-05-10 ENCOUNTER — Other Ambulatory Visit: Payer: Self-pay

## 2020-05-10 DIAGNOSIS — F502 Bulimia nervosa: Secondary | ICD-10-CM | POA: Diagnosis not present

## 2020-05-10 DIAGNOSIS — F319 Bipolar disorder, unspecified: Secondary | ICD-10-CM | POA: Diagnosis not present

## 2020-05-10 NOTE — Patient Instructions (Signed)
1.Continuecarbamazepine 600 mg AM, 400 mg PM  2.Start bupropion 150 mg daily  3.Continue lexapro 10 mg daily  4. Continue ativan 0.5 mg daily as needed for anxiety 5.Next appointment: 2/22 at 9:20

## 2020-05-11 ENCOUNTER — Telehealth: Payer: Self-pay

## 2020-05-11 ENCOUNTER — Other Ambulatory Visit: Payer: Self-pay | Admitting: Psychiatry

## 2020-05-11 MED ORDER — ESCITALOPRAM OXALATE 10 MG PO TABS: 10.0000 mg | ORAL_TABLET | Freq: Every day | ORAL | 0 refills | Status: DC

## 2020-05-11 NOTE — Telephone Encounter (Signed)
  Received fax from [facility] in [place] requesting a refill on the escitalopram  escitalopram (LEXAPRO) 10 MG tablet Medication Date: 11/24/2019 Department: [facility] Ordering/Authorizing: [name], MD    Order Providers  Prescribing Provider Encounter Provider  [name], MD [name], MD   Outpatient Medication Detail   Disp Refills Start End   escitalopram (LEXAPRO) 10 MG tablet 90 tablet 0 02/01/2020 01/31/2021   Sig - Route: Take 1 tablet (10 mg total) by mouth daily. - Oral   Sent to pharmacy as: escitalopram (LEXAPRO) 10 MG tablet   E-Prescribing Status: Receipt confirmed by pharmacy (11/24/2019 8:51 AM EDT)    Pharmacy  [place], [place]. [address]   Additional Information

## 2020-05-17 ENCOUNTER — Telehealth: Payer: Self-pay | Admitting: Psychiatry

## 2020-05-23 ENCOUNTER — Other Ambulatory Visit: Payer: Self-pay

## 2020-05-23 ENCOUNTER — Ambulatory Visit (INDEPENDENT_AMBULATORY_CARE_PROVIDER_SITE_OTHER): Payer: Commercial Managed Care - PPO | Admitting: Internal Medicine

## 2020-05-23 ENCOUNTER — Encounter: Payer: Self-pay | Admitting: Internal Medicine

## 2020-05-23 VITALS — BP 125/73 | HR 79 | Temp 98.3°F | Resp 18 | Ht 74.0 in | Wt 268.4 lb

## 2020-05-23 DIAGNOSIS — G4733 Obstructive sleep apnea (adult) (pediatric): Secondary | ICD-10-CM

## 2020-05-23 DIAGNOSIS — Z7689 Persons encountering health services in other specified circumstances: Secondary | ICD-10-CM

## 2020-05-23 DIAGNOSIS — M2010 Hallux valgus (acquired), unspecified foot: Secondary | ICD-10-CM | POA: Insufficient documentation

## 2020-05-23 DIAGNOSIS — F319 Bipolar disorder, unspecified: Secondary | ICD-10-CM

## 2020-05-23 DIAGNOSIS — M7650 Patellar tendinitis, unspecified knee: Secondary | ICD-10-CM | POA: Insufficient documentation

## 2020-05-23 DIAGNOSIS — M7660 Achilles tendinitis, unspecified leg: Secondary | ICD-10-CM

## 2020-05-23 DIAGNOSIS — E782 Mixed hyperlipidemia: Secondary | ICD-10-CM | POA: Diagnosis not present

## 2020-05-23 DIAGNOSIS — H6123 Impacted cerumen, bilateral: Secondary | ICD-10-CM

## 2020-05-23 DIAGNOSIS — M533 Sacrococcygeal disorders, not elsewhere classified: Secondary | ICD-10-CM | POA: Insufficient documentation

## 2020-05-23 DIAGNOSIS — I10 Essential (primary) hypertension: Secondary | ICD-10-CM

## 2020-05-23 HISTORY — DX: Achilles tendinitis, unspecified leg: M76.60

## 2020-05-23 MED ORDER — CARBAMIDE PEROXIDE 6.5 % OT SOLN: 5.0000 [drp] | Freq: Two times a day (BID) | OTIC | 0 refills | Status: DC

## 2020-05-23 MED ORDER — ATORVASTATIN CALCIUM 40 MG PO TABS: 40.0000 mg | ORAL_TABLET | Freq: Every evening | ORAL | 1 refills | Status: DC

## 2020-05-23 NOTE — Patient Instructions (Signed)
Please continue to take medications as prescribed.  Please use debrox ear drops for excess wax.  Please avoid inserting sharp objects into the ears.  Please follow low salt diet and perform moderate exercise/walking at least 150 mins/week.  You are advised to get flu vaccine soon.  Please get fasting blood tests done before the next visit.

## 2020-05-23 NOTE — Assessment & Plan Note (Addendum)
BP Readings from Last 1 Encounters:  05/23/20 125/73   Well-controlled with Amlodipine, HCTZ and Losartan Counseled for compliance with the medications Advised DASH diet and moderate exercise/walking, at least 150 mins/week
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
## 2020-05-23 NOTE — Assessment & Plan Note (Signed)
On Carbamazepine, Lexapro and Ativan PRN Follows up with Psychiatrist

## 2020-05-23 NOTE — Assessment & Plan Note (Signed)
Care established Previous chart reviewed History and medications reviewed with the patient 

## 2020-05-23 NOTE — Assessment & Plan Note (Signed)
Uses CPAP regularly °

## 2020-05-23 NOTE — Assessment & Plan Note (Signed)
On Atorvastatin 40 mg QD Check lipid profile
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
## 2020-06-07 ENCOUNTER — Telehealth: Payer: Self-pay | Admitting: Psychiatry

## 2020-06-07 ENCOUNTER — Telehealth: Payer: Commercial Managed Care - PPO | Admitting: Psychiatry

## 2020-06-07 ENCOUNTER — Other Ambulatory Visit: Payer: Self-pay

## 2020-06-07 NOTE — Telephone Encounter (Signed)
Sent link for video visit through Epic. Patient did not sign in. Called the patient  for appointment scheduled today. The patient did not answer the phone. Left voice message to contact the office.  

## 2020-07-12 ENCOUNTER — Other Ambulatory Visit: Payer: Self-pay | Admitting: Psychiatry

## 2020-07-12 DIAGNOSIS — F319 Bipolar disorder, unspecified: Secondary | ICD-10-CM

## 2020-07-12 MED ORDER — BUPROPION HCL ER (XL) 150 MG PO TB24: 150.0000 mg | ORAL_TABLET | Freq: Every day | ORAL | 0 refills | Status: DC

## 2020-07-12 MED ORDER — CARBAMAZEPINE 200 MG PO TABS: ORAL_TABLET | ORAL | 0 refills | Status: DC
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
## 2020-07-13 ENCOUNTER — Telehealth: Payer: Self-pay | Admitting: Psychiatry

## 2020-07-13 DIAGNOSIS — F319 Bipolar disorder, unspecified: Secondary | ICD-10-CM

## 2020-07-13 MED ORDER — ESCITALOPRAM OXALATE 10 MG PO TABS: 10.0000 mg | ORAL_TABLET | Freq: Every day | ORAL | 0 refills | Status: DC

## 2020-07-13 MED ORDER — CARBAMAZEPINE 200 MG PO TABS
ORAL_TABLET | ORAL | 0 refills | Status: DC
Start: 2020-07-13 — End: 2020-07-19

## 2020-07-13 NOTE — Telephone Encounter (Addendum)
Received a notification from [facility] yesterday that the patient have been reaching out to our clinic as he ran out of his medication.   After reviewing the chart, bupropion and carbamazepine refills were ordered on 3/29. Left a voice message to notify the patient that the medication were sent.   This clinician was not aware of his medication refill request until receiving a notification from [facility]. Although this clinician reached out to the staffs in both [place] and [place] office (this clinician is now practicing in [place] office), staffs were not aware of any phone calls nor voice messages from him. They agreed that they would usually notify this clinician if there is any request from patients/pharmacy.  [place] staff would  redirect patients to contact [place] office.   Called the patient this morning again on 3/30.  He states that he has been calling the clinic at least a few times as he ran out of his medication (he was unable to tell which number he called). He states that he did talk with the staff after he missed the appointment in Feb. Although he was under the assumption that this staff would call him back about his medication, he never received a phone call and have not heard back from anybody since then.   This clinician apologized for the inconvenience he had, regarding him running out of his medication as this clinician was not aware of his phone calls/refill requests. According to him, he will run of lexapro as well  (although the order was sent in Jan for 90 days). He verbalized understanding that this clinician will contact the pharmacy to make sure this order was sent correctly. Although he is notified that in person visit is available if he prefers (on Mondays for this clinician), he would like to keep the appointment tomorrow as he would not be able to come on Mondays. He states that he has been doing well except that he had these issues of difficulty in getting his  medication. He will consider in person visit in the future.   Discussed with the pharmacist.  Although lexapro was sent for 90 days in January, insurance covered only for 30 days (new order was sent this time). Although this pharmacist was not aware of this patient contacting them due to difficulty in getting medication refill, she states that they will usually reach out to the clinic staff or at least leave a voice message to notify these refills request.

## 2020-07-14 ENCOUNTER — Telehealth (INDEPENDENT_AMBULATORY_CARE_PROVIDER_SITE_OTHER): Payer: Commercial Managed Care - PPO | Admitting: Internal Medicine

## 2020-07-14 ENCOUNTER — Telehealth: Payer: Commercial Managed Care - PPO | Admitting: Psychiatry

## 2020-07-14 ENCOUNTER — Encounter: Payer: Self-pay | Admitting: Internal Medicine

## 2020-07-14 ENCOUNTER — Telehealth: Payer: Self-pay

## 2020-07-14 ENCOUNTER — Telehealth: Payer: Self-pay | Admitting: Psychiatry

## 2020-07-14 ENCOUNTER — Other Ambulatory Visit: Payer: Self-pay

## 2020-07-14 DIAGNOSIS — Z860100 Personal history of colon polyps, unspecified: Secondary | ICD-10-CM | POA: Insufficient documentation

## 2020-07-14 DIAGNOSIS — J069 Acute upper respiratory infection, unspecified: Secondary | ICD-10-CM

## 2020-07-14 DIAGNOSIS — D7589 Other specified diseases of blood and blood-forming organs: Secondary | ICD-10-CM | POA: Insufficient documentation

## 2020-07-14 DIAGNOSIS — Z20822 Contact with and (suspected) exposure to covid-19: Secondary | ICD-10-CM

## 2020-07-14 DIAGNOSIS — K219 Gastro-esophageal reflux disease without esophagitis: Secondary | ICD-10-CM | POA: Insufficient documentation

## 2020-07-14 MED ORDER — AZITHROMYCIN 250 MG PO TABS: ORAL_TABLET | ORAL | 0 refills | Status: DC

## 2020-07-14 MED ORDER — SALINE SPRAY 0.65 % NA SOLN: 1.0000 | NASAL | 0 refills | Status: DC | PRN
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
## 2020-07-14 NOTE — Telephone Encounter (Signed)
Patient called to let the nurse know he took covid test and it was negative.

## 2020-07-14 NOTE — Telephone Encounter (Signed)
Sent Azithromycin and [place] spray for him. Thank you.

## 2020-07-14 NOTE — Telephone Encounter (Signed)
FYI

## 2020-07-14 NOTE — Patient Instructions (Addendum)

## 2020-07-14 NOTE — Addendum Note (Signed)
Addended byIhor Dow on: 07/14/2020 04:55 PM   Modules accepted: Orders

## 2020-07-14 NOTE — Telephone Encounter (Signed)
Sent link for video visit through Epic. Patient did not sign in. Called the patient twice for appointment scheduled today. The patient did not answer the phone. Left voice message to contact the office ([phone]).   
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
## 2020-07-15 NOTE — Telephone Encounter (Signed)
LVM to let pt know this has been sent in

## 2020-07-19 ENCOUNTER — Telehealth (INDEPENDENT_AMBULATORY_CARE_PROVIDER_SITE_OTHER): Payer: Commercial Managed Care - PPO | Admitting: Psychiatry

## 2020-07-19 ENCOUNTER — Encounter: Payer: Self-pay | Admitting: Psychiatry

## 2020-07-19 ENCOUNTER — Other Ambulatory Visit: Payer: Self-pay

## 2020-07-19 DIAGNOSIS — F319 Bipolar disorder, unspecified: Secondary | ICD-10-CM

## 2020-07-19 DIAGNOSIS — F502 Bulimia nervosa: Secondary | ICD-10-CM

## 2020-07-19 MED ORDER — LORAZEPAM 0.5 MG PO TABS
0.5000 mg | ORAL_TABLET | Freq: Every day | ORAL | 0 refills | Status: DC | PRN
Start: 2020-07-19 — End: 2020-10-24

## 2020-07-19 MED ORDER — CARBAMAZEPINE 200 MG PO TABS
ORAL_TABLET | ORAL | 0 refills | Status: DC
Start: 2020-07-19 — End: 2020-09-13

## 2020-07-19 MED ORDER — BUPROPION HCL ER (XL) 150 MG PO TB24: 150.0000 mg | ORAL_TABLET | Freq: Every day | ORAL | 0 refills | Status: DC

## 2020-07-19 MED ORDER — ESCITALOPRAM OXALATE 10 MG PO TABS: 10.0000 mg | ORAL_TABLET | Freq: Every day | ORAL | 0 refills | Status: DC

## 2020-07-19 NOTE — Patient Instructions (Addendum)
1.Continuecarbamazepine 600 mg AM, 400 mg PM  2.Continue bupropion 150 mg daily  3.Continue lexapro 10 mg daily 4. Continue ativan 0.5 mg daily as needed for anxiety 5.Next appointment:5/27 at 11 AM   Please contact our office if any concerns.  [phone] (to schedule appointment) [phone] (to speak with the nurse at the clinic)

## 2020-08-17 LAB — LIPID PANEL
Chol/HDL Ratio: 3.3 ratio (ref 0.0–5.0)
Cholesterol, Total: 210 mg/dL — ABNORMAL HIGH (ref 100–199)
HDL: 64 mg/dL (ref >39)
LDL Chol Calc (NIH): 119 mg/dL — ABNORMAL HIGH (ref 0–99)
Triglycerides: 157 mg/dL — ABNORMAL HIGH (ref 0–149)
VLDL Cholesterol Cal: 27 mg/dL (ref 5–40)

## 2020-08-17 LAB — CMP14+EGFR
ALT: 35 IU/L (ref 0–44)
AST: 22 IU/L (ref 0–40)
Albumin/Globulin Ratio: 1.8 (ref 1.2–2.2)
Albumin: 4.4 g/dL (ref 3.8–4.8)
Alkaline Phosphatase: 64 IU/L (ref 44–121)
BUN/Creatinine Ratio: 18 (ref 10–24)
BUN: 17 mg/dL (ref 8–27)
Bilirubin Total: 0.2 mg/dL (ref 0.0–1.2)
CO2: 25 mmol/L (ref 20–29)
Calcium: 9.5 mg/dL (ref 8.6–10.2)
Chloride: 101 mmol/L (ref 96–106)
Creatinine, Ser: 0.96 mg/dL (ref 0.76–1.27)
Globulin, Total: 2.5 g/dL (ref 1.5–4.5)
Glucose: 94 mg/dL (ref 65–99)
Potassium: 4.8 mmol/L (ref 3.5–5.2)
Sodium: 140 mmol/L (ref 134–144)
Total Protein: 6.9 g/dL (ref 6.0–8.5)
eGFR: 89 mL/min/{1.73_m2} (ref >59)

## 2020-08-17 LAB — CBC WITH DIFFERENTIAL/PLATELET
Basophils Absolute: 0.1 10*3/uL (ref 0.0–0.2)
Basos: 1 %
EOS (ABSOLUTE): 0.2 10*3/uL (ref 0.0–0.4)
Eos: 3 %
Hematocrit: 45.8 % (ref 37.5–51.0)
Hemoglobin: 14.9 g/dL (ref 13.0–17.7)
Immature Grans (Abs): 0 10*3/uL (ref 0.0–0.1)
Immature Granulocytes: 0 %
Lymphocytes Absolute: 1.5 10*3/uL (ref 0.7–3.1)
Lymphs: 25 %
MCH: 32.7 pg (ref 26.6–33.0)
MCHC: 32.5 g/dL (ref 31.5–35.7)
MCV: 100 fL — ABNORMAL HIGH (ref 79–97)
Monocytes Absolute: 0.6 10*3/uL (ref 0.1–0.9)
Monocytes: 11 %
Neutrophils Absolute: 3.6 10*3/uL (ref 1.4–7.0)
Neutrophils: 60 %
Platelets: 199 10*3/uL (ref 150–450)
RBC: 4.56 x10E6/uL (ref 4.14–5.80)
RDW: 12.6 % (ref 11.6–15.4)
WBC: 6 10*3/uL (ref 3.4–10.8)

## 2020-08-17 LAB — HEMOGLOBIN A1C
Est. average glucose Bld gHb Est-mCnc: 117 mg/dL
Hgb A1c MFr Bld: 5.7 % — ABNORMAL HIGH (ref 4.8–5.6)

## 2020-08-22 ENCOUNTER — Encounter: Payer: Self-pay | Admitting: Internal Medicine

## 2020-08-22 ENCOUNTER — Other Ambulatory Visit: Payer: Self-pay

## 2020-08-22 ENCOUNTER — Ambulatory Visit (INDEPENDENT_AMBULATORY_CARE_PROVIDER_SITE_OTHER): Payer: BC Managed Care – PPO | Admitting: Internal Medicine

## 2020-08-22 ENCOUNTER — Other Ambulatory Visit: Payer: Self-pay | Admitting: *Deleted

## 2020-08-22 VITALS — BP 127/75 | HR 73 | Temp 98.3°F | Resp 18 | Ht 74.0 in | Wt 263.6 lb

## 2020-08-22 DIAGNOSIS — G4733 Obstructive sleep apnea (adult) (pediatric): Secondary | ICD-10-CM | POA: Diagnosis not present

## 2020-08-22 DIAGNOSIS — I10 Essential (primary) hypertension: Secondary | ICD-10-CM

## 2020-08-22 DIAGNOSIS — K219 Gastro-esophageal reflux disease without esophagitis: Secondary | ICD-10-CM

## 2020-08-22 DIAGNOSIS — Z0001 Encounter for general adult medical examination with abnormal findings: Secondary | ICD-10-CM | POA: Diagnosis not present

## 2020-08-22 DIAGNOSIS — R7303 Prediabetes: Secondary | ICD-10-CM

## 2020-08-22 DIAGNOSIS — Z0000 Encounter for general adult medical examination without abnormal findings: Secondary | ICD-10-CM

## 2020-08-22 DIAGNOSIS — Z125 Encounter for screening for malignant neoplasm of prostate: Secondary | ICD-10-CM

## 2020-08-22 DIAGNOSIS — E782 Mixed hyperlipidemia: Secondary | ICD-10-CM

## 2020-08-22 MED ORDER — LOSARTAN POTASSIUM 100 MG PO TABS: 100.0000 mg | ORAL_TABLET | Freq: Every day | ORAL | 0 refills | Status: DC

## 2020-08-22 MED ORDER — PANTOPRAZOLE SODIUM 40 MG PO TBEC: 40.0000 mg | DELAYED_RELEASE_TABLET | Freq: Every day | ORAL | 5 refills | Status: DC

## 2020-08-22 MED ORDER — AMLODIPINE BESYLATE 5 MG PO TABS: 5.0000 mg | ORAL_TABLET | Freq: Every day | ORAL | 0 refills | Status: DC

## 2020-08-22 MED ORDER — HYDROCHLOROTHIAZIDE 25 MG PO TABS: 25.0000 mg | ORAL_TABLET | Freq: Every morning | ORAL | 0 refills | Status: DC

## 2020-08-22 NOTE — Patient Instructions (Signed)
Please continue taking medications as prescribed.  Avoid hot and spicy food to avoid acid reflux.  Please continue to follow DASH diet and perform moderate exercise/walking at least 150 mins/week.
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
## 2020-08-22 NOTE — Assessment & Plan Note (Signed)
Was on Pantoprazole, restarted Pantoprazole 40 mg QD

## 2020-08-22 NOTE — Assessment & Plan Note (Signed)
Uses CPAP regularly °

## 2020-08-22 NOTE — Assessment & Plan Note (Signed)
Lipid profile reviewed Continue Atorvastatin 40 mg qHS, advised for compliance 

## 2020-08-22 NOTE — Assessment & Plan Note (Signed)
BP Readings from Last 1 Encounters:  08/22/20 127/75   Well-controlled with Amlodipine, HCTZ and Losartan Counseled for compliance with the medications Advised DASH diet and moderate exercise/walking, at least 150 mins/week

## 2020-09-09 ENCOUNTER — Other Ambulatory Visit: Payer: Self-pay

## 2020-09-09 ENCOUNTER — Encounter: Payer: Commercial Managed Care - PPO | Admitting: Psychiatry

## 2020-09-09 ENCOUNTER — Telehealth: Payer: Self-pay | Admitting: Psychiatry
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
## 2020-09-09 NOTE — Telephone Encounter (Signed)
Sent link for video visit through Epic. Patient did not sign in. Called the patient for appointment scheduled today. The patient did not answer the phone. Left voice message to contact the office ([phone]).   ?

## 2020-09-13 ENCOUNTER — Telehealth (INDEPENDENT_AMBULATORY_CARE_PROVIDER_SITE_OTHER): Payer: BC Managed Care – PPO | Admitting: Psychiatry

## 2020-09-13 ENCOUNTER — Encounter: Payer: Self-pay | Admitting: Psychiatry

## 2020-09-13 DIAGNOSIS — F319 Bipolar disorder, unspecified: Secondary | ICD-10-CM | POA: Diagnosis not present

## 2020-09-13 DIAGNOSIS — F502 Bulimia nervosa: Secondary | ICD-10-CM

## 2020-09-13 MED ORDER — CARBAMAZEPINE 200 MG PO TABS: ORAL_TABLET | ORAL | 1 refills | Status: DC

## 2020-09-13 MED ORDER — BUPROPION HCL ER (XL) 150 MG PO TB24: 150.0000 mg | ORAL_TABLET | Freq: Every day | ORAL | 0 refills | Status: DC

## 2020-09-13 MED ORDER — ESCITALOPRAM OXALATE 10 MG PO TABS: 10.0000 mg | ORAL_TABLET | Freq: Every day | ORAL | 1 refills | Status: DC

## 2020-09-13 NOTE — Progress Notes (Signed)
This encounter was created in error - please disregard.
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
## 2020-09-13 NOTE — Patient Instructions (Addendum)
1.Continuecarbamazepine 600 mg AM, 400 mg PM  2.Continue bupropion 150 mg daily 3.Continue lexapro 10 mg daily 4. Continue ativan 0.5 mg daily as needed for anxiety 5.Next appointment:7/11 at 4:30   The next visit will be in person visit.   [facility]  Address: [address], [place], [place] [address]

## 2020-10-18 ENCOUNTER — Encounter: Payer: Self-pay | Admitting: Family Medicine

## 2020-10-19 ENCOUNTER — Encounter: Payer: Self-pay | Admitting: Family Medicine

## 2020-10-19 ENCOUNTER — Telehealth (INDEPENDENT_AMBULATORY_CARE_PROVIDER_SITE_OTHER): Payer: BC Managed Care – PPO | Admitting: Family Medicine

## 2020-10-19 ENCOUNTER — Other Ambulatory Visit: Payer: Self-pay

## 2020-10-19 DIAGNOSIS — U071 COVID-19: Secondary | ICD-10-CM | POA: Insufficient documentation

## 2020-10-19 NOTE — Assessment & Plan Note (Addendum)
Needs booster in next 3 to 4 weeks, curently on day 5 post positive test and clinically improving, advised  Continue supportive management with OTC decongestant , analgesic and antpyretic

## 2020-10-19 NOTE — Patient Instructions (Signed)
F/u with Dr Posey Pronto as before, call if you need [place] sooner  I recommend you get your booster in 4 weeks  Thanks for choosing [facility], we consider it a privelige to serve you.

## 2020-10-19 NOTE — Progress Notes (Signed)
Virtual Visit via Telephone Note  I connected with SOMA LIZAK on 10/19/20 at  1:00 PM EDT by telephone and verified that I am speaking with the correct person using two identifiers.  Location: Patient: home Provider: office   I discussed the limitations, risks, security and privacy concerns of performing an evaluation and management service by telephone and the availability of in person appointments. I also discussed with the patient that there may be a patient responsible charge related to this service. The patient expressed understanding and agreed to proceed.   History of Present Illness: Covid positive on July 2 from home test malaise, cough and chest soreness po  hypersensitivity of skin on back and scalp Loose stool  for 2 days  Only OTC meds have been taken , improving and wants to know how soon he can get his booster which is past due  Observations/Objective: BP (!) 135/92  Good communication with no confusion and intact memory. Alert and oriented x 3 No signs of respiratory distress during speech   Assessment and Plan: COVID-19 virus infection Needs booster in next 3 to 4 weeks, curently on day 5 post positive test and clinically improving, advised  Continue supportive management with OTC decongestant , analgesic and antpyretic   Follow Up Instructions:    I discussed the assessment and treatment plan with the patient. The patient was provided an opportunity to ask questions and all were answered. The patient agreed with the plan and demonstrated an understanding of the instructions.   The patient was advised to call back or seek an in-person evaluation if the symptoms worsen or if the condition fails to improve as anticipated.  I provided 6 minutes of non-face-to-face time during this encounter.   Tula Nakayama, MD
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
## 2020-10-23 ENCOUNTER — Encounter: Payer: Self-pay | Admitting: Family Medicine

## 2020-10-24 ENCOUNTER — Telehealth (INDEPENDENT_AMBULATORY_CARE_PROVIDER_SITE_OTHER): Payer: BC Managed Care – PPO | Admitting: Psychiatry

## 2020-10-24 ENCOUNTER — Other Ambulatory Visit: Payer: Self-pay

## 2020-10-24 ENCOUNTER — Encounter: Payer: Self-pay | Admitting: Psychiatry

## 2020-10-24 DIAGNOSIS — F319 Bipolar disorder, unspecified: Secondary | ICD-10-CM | POA: Diagnosis not present

## 2020-10-24 DIAGNOSIS — F502 Bulimia nervosa: Secondary | ICD-10-CM

## 2020-10-24 MED ORDER — LORAZEPAM 0.5 MG PO TABS: 0.5000 mg | ORAL_TABLET | Freq: Every day | ORAL | 0 refills | Status: DC | PRN

## 2020-10-24 NOTE — Patient Instructions (Signed)
1. Continue carbamazepine 600 mg AM, 400 mg PM  2. Continue bupropion 150 mg daily  3. Continue lexapro 10 mg daily  4. Continue ativan 0.5 mg daily as needed for anxiety - only a month of refill sent based on his request 5. Next appointment: 10/7 at 9:30

## 2020-11-16 ENCOUNTER — Other Ambulatory Visit: Payer: Self-pay | Admitting: Internal Medicine

## 2020-11-16 DIAGNOSIS — E782 Mixed hyperlipidemia: Secondary | ICD-10-CM

## 2020-12-17 ENCOUNTER — Encounter: Payer: Self-pay | Admitting: Internal Medicine

## 2020-12-20 ENCOUNTER — Telehealth: Payer: Self-pay

## 2020-12-20 NOTE — Telephone Encounter (Signed)
Medication management - Telephone call with pt, after speaking with Josh, pharmacist at pt's [facility] on [address] in [place], to verify they did still have a 90 day refill for pt's Lexapro and were filling it now for him.  Patient to call back if any other issues filling his Lexapro today.

## 2020-12-22 ENCOUNTER — Other Ambulatory Visit: Payer: Self-pay

## 2020-12-22 ENCOUNTER — Encounter: Payer: Self-pay | Admitting: Internal Medicine

## 2020-12-22 ENCOUNTER — Ambulatory Visit (INDEPENDENT_AMBULATORY_CARE_PROVIDER_SITE_OTHER): Payer: BC Managed Care – PPO | Admitting: Internal Medicine

## 2020-12-22 DIAGNOSIS — N529 Male erectile dysfunction, unspecified: Secondary | ICD-10-CM | POA: Diagnosis not present

## 2020-12-22 MED ORDER — SILDENAFIL CITRATE 25 MG PO TABS: 25.0000 mg | ORAL_TABLET | Freq: Every day | ORAL | 1 refills | Status: DC | PRN

## 2020-12-22 NOTE — Assessment & Plan Note (Signed)
Has tried Viagra and Cialis in the past Started Viagra 25 mg daily as needed
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
## 2020-12-23 MED ORDER — TADALAFIL 5 MG PO TABS: 5.0000 mg | ORAL_TABLET | Freq: Every day | ORAL | 1 refills | Status: DC | PRN

## 2020-12-23 NOTE — Addendum Note (Signed)
Addended byIhor Dow on: 12/23/2020 08:01 AM   Modules accepted: Orders

## 2020-12-26 ENCOUNTER — Encounter: Payer: Self-pay | Admitting: Internal Medicine

## 2021-01-12 NOTE — Telephone Encounter (Signed)
CLOSED
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
## 2021-01-20 ENCOUNTER — Telehealth (INDEPENDENT_AMBULATORY_CARE_PROVIDER_SITE_OTHER): Payer: BC Managed Care – PPO | Admitting: Psychiatry

## 2021-01-20 ENCOUNTER — Other Ambulatory Visit: Payer: Self-pay

## 2021-01-20 ENCOUNTER — Encounter: Payer: Self-pay | Admitting: Psychiatry

## 2021-01-20 DIAGNOSIS — F411 Generalized anxiety disorder: Secondary | ICD-10-CM | POA: Diagnosis not present

## 2021-01-20 DIAGNOSIS — F319 Bipolar disorder, unspecified: Secondary | ICD-10-CM | POA: Diagnosis not present

## 2021-01-20 DIAGNOSIS — F502 Bulimia nervosa: Secondary | ICD-10-CM | POA: Diagnosis not present

## 2021-01-20 MED ORDER — ESCITALOPRAM OXALATE 10 MG PO TABS: 10.0000 mg | ORAL_TABLET | Freq: Every day | ORAL | 1 refills | Status: DC

## 2021-01-20 MED ORDER — CARBAMAZEPINE 200 MG PO TABS: ORAL_TABLET | ORAL | 1 refills | Status: DC

## 2021-01-20 MED ORDER — LORAZEPAM 0.5 MG PO TABS: 0.5000 mg | ORAL_TABLET | Freq: Every day | ORAL | 0 refills | Status: DC | PRN

## 2021-01-20 MED ORDER — BUPROPION HCL ER (XL) 150 MG PO TB24: 150.0000 mg | ORAL_TABLET | Freq: Every day | ORAL | 0 refills | Status: DC

## 2021-01-20 NOTE — Patient Instructions (Signed)
1. Continue carbamazepine 600 mg AM, 400 mg PM  2. Continue bupropion 150 mg daily  3. Continue lexapro 10 mg daily  4. Continue ativan 0.5 mg daily as needed for anxiety  5. Next appointment: 1/6 at 9:30

## 2021-01-23 ENCOUNTER — Other Ambulatory Visit: Payer: Self-pay | Admitting: Internal Medicine

## 2021-01-23 DIAGNOSIS — K219 Gastro-esophageal reflux disease without esophagitis: Secondary | ICD-10-CM

## 2021-02-10 ENCOUNTER — Other Ambulatory Visit: Payer: Self-pay | Admitting: Internal Medicine

## 2021-02-21 ENCOUNTER — Encounter: Payer: Commercial Managed Care - PPO | Admitting: Internal Medicine

## 2021-03-13 ENCOUNTER — Telehealth: Payer: Self-pay

## 2021-03-13 NOTE — Telephone Encounter (Signed)
pt called states that he is still having an issues with anger and his mood and wanted to know if he can go up on the carbamazepine

## 2021-03-13 NOTE — Telephone Encounter (Signed)
Contacted the patient.  He states that he has been getting irritable with his coworkers.  It has been going on for a while, although he does not have much issues outside of work.  He also notices that he has been more nervous/anxious, and taking the lorazepam more frequently.  He denies decreased need for sleep, euphonia, or increased goal-directed activity. He denies SI.  Although he initially asked if he could increase up the dose of carbamazepine, he agreed with the following plans.  -Increase Lexapro 20 mg daily (he states that he has enough pills until the next visit) - Next appointment: 12/2 at 8:30, video
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
## 2021-03-17 ENCOUNTER — Telehealth (INDEPENDENT_AMBULATORY_CARE_PROVIDER_SITE_OTHER): Payer: Commercial Managed Care - PPO | Admitting: Psychiatry

## 2021-03-17 ENCOUNTER — Other Ambulatory Visit: Payer: Self-pay

## 2021-03-17 ENCOUNTER — Encounter: Payer: Self-pay | Admitting: Psychiatry

## 2021-03-17 DIAGNOSIS — F319 Bipolar disorder, unspecified: Secondary | ICD-10-CM

## 2021-03-17 DIAGNOSIS — F411 Generalized anxiety disorder: Secondary | ICD-10-CM | POA: Diagnosis not present

## 2021-03-17 DIAGNOSIS — F502 Bulimia nervosa: Secondary | ICD-10-CM | POA: Diagnosis not present

## 2021-03-17 MED ORDER — ESCITALOPRAM OXALATE 20 MG PO TABS: 20.0000 mg | ORAL_TABLET | Freq: Every day | ORAL | 0 refills | Status: DC

## 2021-03-17 MED ORDER — LORAZEPAM 0.5 MG PO TABS: 0.5000 mg | ORAL_TABLET | Freq: Every day | ORAL | 1 refills | Status: DC | PRN

## 2021-03-17 NOTE — Patient Instructions (Addendum)
1. Continue carbamazepine 600 mg AM, 400 mg PM  2. Continue bupropion 150 mg daily  3. Increase lexapro 20 mg daily  4. Continue ativan 0.5 mg daily as needed for anxiety  5. Next appointment: 1/6 at 9:30, video

## 2021-03-30 ENCOUNTER — Encounter: Payer: Self-pay | Admitting: Internal Medicine

## 2021-03-30 ENCOUNTER — Other Ambulatory Visit: Payer: Self-pay

## 2021-03-30 DIAGNOSIS — E782 Mixed hyperlipidemia: Secondary | ICD-10-CM

## 2021-03-30 MED ORDER — ATORVASTATIN CALCIUM 40 MG PO TABS
40.0000 mg | ORAL_TABLET | Freq: Every evening | ORAL | 1 refills | Status: DC
Start: 2021-03-30 — End: 2021-09-19

## 2021-04-12 ENCOUNTER — Other Ambulatory Visit: Payer: Self-pay | Admitting: Psychiatry
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
## 2021-04-21 ENCOUNTER — Encounter: Payer: Self-pay | Admitting: Psychiatry

## 2021-04-21 ENCOUNTER — Other Ambulatory Visit: Payer: Self-pay

## 2021-04-21 ENCOUNTER — Telehealth (INDEPENDENT_AMBULATORY_CARE_PROVIDER_SITE_OTHER): Payer: 59 | Admitting: Psychiatry

## 2021-04-21 DIAGNOSIS — F411 Generalized anxiety disorder: Secondary | ICD-10-CM | POA: Diagnosis not present

## 2021-04-21 DIAGNOSIS — F319 Bipolar disorder, unspecified: Secondary | ICD-10-CM | POA: Diagnosis not present

## 2021-04-21 DIAGNOSIS — F502 Bulimia nervosa: Secondary | ICD-10-CM

## 2021-04-21 MED ORDER — LORAZEPAM 0.5 MG PO TABS: 0.5000 mg | ORAL_TABLET | Freq: Every day | ORAL | 1 refills | Status: AC | PRN

## 2021-04-21 MED ORDER — ESCITALOPRAM OXALATE 20 MG PO TABS: 20.0000 mg | ORAL_TABLET | Freq: Every day | ORAL | 0 refills | Status: DC

## 2021-04-21 NOTE — Patient Instructions (Signed)
1. Continue carbamazepine 600 mg AM, 400 mg PM  2. Continue bupropion 150 mg daily  3. Continue lexapro 20 mg daily  4. Continue ativan 0.5 mg daily as needed for anxiety  5. Next appointment: 3/2 at 8 AM

## 2021-05-29 ENCOUNTER — Encounter: Payer: Self-pay | Admitting: Internal Medicine

## 2021-05-29 ENCOUNTER — Ambulatory Visit (INDEPENDENT_AMBULATORY_CARE_PROVIDER_SITE_OTHER): Payer: 59 | Admitting: Internal Medicine

## 2021-05-29 ENCOUNTER — Other Ambulatory Visit: Payer: Self-pay

## 2021-05-29 VITALS — BP 124/72 | HR 75 | Resp 18 | Ht 74.0 in | Wt 245.0 lb

## 2021-05-29 DIAGNOSIS — G4733 Obstructive sleep apnea (adult) (pediatric): Secondary | ICD-10-CM | POA: Diagnosis not present

## 2021-05-29 DIAGNOSIS — I10 Essential (primary) hypertension: Secondary | ICD-10-CM

## 2021-05-29 DIAGNOSIS — Z23 Encounter for immunization: Secondary | ICD-10-CM | POA: Diagnosis not present

## 2021-05-29 DIAGNOSIS — Z0001 Encounter for general adult medical examination with abnormal findings: Secondary | ICD-10-CM

## 2021-05-29 MED ORDER — LOSARTAN POTASSIUM 100 MG PO TABS: 100.0000 mg | ORAL_TABLET | Freq: Every day | ORAL | 3 refills | Status: DC

## 2021-05-29 MED ORDER — AMLODIPINE BESYLATE 5 MG PO TABS: 5.0000 mg | ORAL_TABLET | Freq: Every day | ORAL | 3 refills | Status: DC
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
## 2021-05-29 NOTE — Patient Instructions (Signed)
Please stop taking HCTZ. Please continue taking other medications as prescribed.  Please continue to follow low salt diet and perform moderate exercise/walking at least 150 mins/week.

## 2021-05-30 ENCOUNTER — Telehealth: Payer: Self-pay

## 2021-05-30 LAB — CMP14+EGFR
ALT: 22 IU/L (ref 0–44)
AST: 21 IU/L (ref 0–40)
Albumin/Globulin Ratio: 2 (ref 1.2–2.2)
Albumin: 4.5 g/dL (ref 3.8–4.8)
Alkaline Phosphatase: 74 IU/L (ref 44–121)
BUN/Creatinine Ratio: 20 (ref 10–24)
BUN: 19 mg/dL (ref 8–27)
Bilirubin Total: <0.2 mg/dL (ref 0.0–1.2)
CO2: 24 mmol/L (ref 20–29)
Calcium: 9.2 mg/dL (ref 8.6–10.2)
Chloride: 104 mmol/L (ref 96–106)
Creatinine, Ser: 0.97 mg/dL (ref 0.76–1.27)
Globulin, Total: 2.2 g/dL (ref 1.5–4.5)
Glucose: 95 mg/dL (ref 70–99)
Potassium: 4.4 mmol/L (ref 3.5–5.2)
Sodium: 142 mmol/L (ref 134–144)
Total Protein: 6.7 g/dL (ref 6.0–8.5)
eGFR: 88 mL/min/{1.73_m2} (ref >59)

## 2021-05-30 LAB — CBC WITH DIFFERENTIAL/PLATELET
Basophils Absolute: 0.1 10*3/uL (ref 0.0–0.2)
Basos: 1 %
EOS (ABSOLUTE): 0.2 10*3/uL (ref 0.0–0.4)
Eos: 2 %
Hematocrit: 41.6 % (ref 37.5–51.0)
Hemoglobin: 14 g/dL (ref 13.0–17.7)
Immature Grans (Abs): 0 10*3/uL (ref 0.0–0.1)
Immature Granulocytes: 0 %
Lymphocytes Absolute: 1.6 10*3/uL (ref 0.7–3.1)
Lymphs: 23 %
MCH: 32.6 pg (ref 26.6–33.0)
MCHC: 33.7 g/dL (ref 31.5–35.7)
MCV: 97 fL (ref 79–97)
Monocytes Absolute: 0.6 10*3/uL (ref 0.1–0.9)
Monocytes: 9 %
Neutrophils Absolute: 4.5 10*3/uL (ref 1.4–7.0)
Neutrophils: 65 %
Platelets: 220 10*3/uL (ref 150–450)
RBC: 4.29 x10E6/uL (ref 4.14–5.80)
RDW: 12.9 % (ref 11.6–15.4)
WBC: 6.9 10*3/uL (ref 3.4–10.8)

## 2021-05-30 LAB — LIPID PANEL
Chol/HDL Ratio: 3.7 ratio (ref 0.0–5.0)
Cholesterol, Total: 193 mg/dL (ref 100–199)
HDL: 52 mg/dL (ref >39)
LDL Chol Calc (NIH): 104 mg/dL — ABNORMAL HIGH (ref 0–99)
Triglycerides: 219 mg/dL — ABNORMAL HIGH (ref 0–149)
VLDL Cholesterol Cal: 37 mg/dL (ref 5–40)

## 2021-05-30 LAB — HEMOGLOBIN A1C
Est. average glucose Bld gHb Est-mCnc: 114 mg/dL
Hgb A1c MFr Bld: 5.6 % (ref 4.8–5.6)

## 2021-05-30 LAB — TSH: TSH: 3 u[IU]/mL (ref 0.450–4.500)

## 2021-05-30 LAB — PSA: Prostate Specific Ag, Serum: 1.2 ng/mL (ref 0.0–4.0)

## 2021-05-30 LAB — VITAMIN D 25 HYDROXY (VIT D DEFICIENCY, FRACTURES): Vit D, 25-Hydroxy: 14.9 ng/mL — ABNORMAL LOW (ref 30.0–100.0)

## 2021-05-30 NOTE — Assessment & Plan Note (Signed)
Uses CPAP regularly, continues to benefit from it 

## 2021-05-30 NOTE — Assessment & Plan Note (Signed)

## 2021-05-30 NOTE — Telephone Encounter (Signed)
Patient returning lab result call 

## 2021-05-30 NOTE — Assessment & Plan Note (Addendum)
BP Readings from Last 1 Encounters:  05/29/21 124/72   Well-controlled with Amlodipine, HCTZ and Losartan DC HCTZ as he has urinary frequency with it, advised to contact with BP readings after 1 week Counseled for compliance with the medications Advised DASH diet and moderate exercise/walking, at least 150 mins/week

## 2021-05-30 NOTE — Telephone Encounter (Signed)
LVM for pt to call the office.

## 2021-05-31 ENCOUNTER — Encounter: Payer: Self-pay | Admitting: Internal Medicine

## 2021-05-31 ENCOUNTER — Encounter: Payer: Self-pay | Admitting: *Deleted
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
## 2021-06-15 ENCOUNTER — Encounter: Payer: Self-pay | Admitting: Psychiatry

## 2021-06-15 ENCOUNTER — Telehealth (INDEPENDENT_AMBULATORY_CARE_PROVIDER_SITE_OTHER): Payer: 59 | Admitting: Psychiatry

## 2021-06-15 ENCOUNTER — Other Ambulatory Visit: Payer: Self-pay

## 2021-06-15 DIAGNOSIS — F411 Generalized anxiety disorder: Secondary | ICD-10-CM

## 2021-06-15 DIAGNOSIS — F502 Bulimia nervosa: Secondary | ICD-10-CM

## 2021-06-15 DIAGNOSIS — F319 Bipolar disorder, unspecified: Secondary | ICD-10-CM

## 2021-06-15 MED ORDER — BUPROPION HCL ER (XL) 150 MG PO TB24: 150.0000 mg | ORAL_TABLET | Freq: Every day | ORAL | 0 refills | Status: DC

## 2021-06-15 NOTE — Patient Instructions (Signed)
1. Continue carbamazepine 600 mg AM, 400 mg PM  ?2. Continue bupropion 150 mg daily  ?3. Continue lexapro 20 mg daily  ?4. Hold Ativan  ?5. Next appointment: 5/8 at 10:30, in person ? ?The next visit will be in person visit. Please arrive 15 mins before the scheduled time.  ? ?Calvin Cole  ?Address: [address], [place], [place] [address]   ?

## 2021-07-03 ENCOUNTER — Other Ambulatory Visit: Payer: Self-pay | Admitting: Psychiatry

## 2021-07-03 NOTE — Telephone Encounter (Signed)
Discussed with the pharmacist. He refilled lexapro on 12/3, and 1/10, each for 90 tabs. Will decline the refill at this time as he should have enough meds until the next visit.  ? ?

## 2021-07-25 ENCOUNTER — Ambulatory Visit (INDEPENDENT_AMBULATORY_CARE_PROVIDER_SITE_OTHER): Payer: 59

## 2021-07-25 ENCOUNTER — Other Ambulatory Visit: Payer: Self-pay | Admitting: Internal Medicine

## 2021-07-25 ENCOUNTER — Ambulatory Visit (INDEPENDENT_AMBULATORY_CARE_PROVIDER_SITE_OTHER): Payer: 59 | Admitting: Orthopaedic Surgery

## 2021-07-25 ENCOUNTER — Encounter: Payer: Self-pay | Admitting: Orthopaedic Surgery

## 2021-07-25 VITALS — BP 150/94 | HR 69 | Ht 73.0 in | Wt 258.0 lb

## 2021-07-25 DIAGNOSIS — K219 Gastro-esophageal reflux disease without esophagitis: Secondary | ICD-10-CM

## 2021-07-25 DIAGNOSIS — M545 Low back pain, unspecified: Secondary | ICD-10-CM

## 2021-07-28 NOTE — Addendum Note (Signed)
Addended by: Marybelle Killings on: 07/28/2021 01:11 PM ? ? Modules accepted: Orders ? ?
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
## 2021-08-10 ENCOUNTER — Institutional Professional Consult (permissible substitution): Payer: Self-pay | Admitting: Pulmonary Disease
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
## 2021-08-21 ENCOUNTER — Ambulatory Visit (INDEPENDENT_AMBULATORY_CARE_PROVIDER_SITE_OTHER): Payer: 59 | Admitting: Psychiatry

## 2021-08-21 ENCOUNTER — Encounter: Payer: Self-pay | Admitting: Psychiatry

## 2021-08-21 VITALS — BP 137/88 | HR 73 | Temp 98.5°F | Wt 256.4 lb

## 2021-08-21 DIAGNOSIS — F502 Bulimia nervosa, unspecified: Secondary | ICD-10-CM

## 2021-08-21 DIAGNOSIS — F411 Generalized anxiety disorder: Secondary | ICD-10-CM

## 2021-08-21 DIAGNOSIS — F319 Bipolar disorder, unspecified: Secondary | ICD-10-CM | POA: Diagnosis not present

## 2021-08-21 MED ORDER — CARBAMAZEPINE 200 MG PO TABS: ORAL_TABLET | ORAL | 0 refills | Status: DC

## 2021-08-21 MED ORDER — BUPROPION HCL ER (XL) 150 MG PO TB24: 150.0000 mg | ORAL_TABLET | Freq: Every day | ORAL | 0 refills | Status: DC

## 2021-08-21 MED ORDER — ESCITALOPRAM OXALATE 20 MG PO TABS: 20.0000 mg | ORAL_TABLET | Freq: Every day | ORAL | 1 refills | Status: DC

## 2021-08-21 NOTE — Patient Instructions (Addendum)
Continue carbamazepine 600 mg AM, 400 mg PM  ?Continue bupropion 150 mg daily  ?Continue lexapro 20 mg daily  ?Next appointment: 8/9 at 10 AM, video ?

## 2021-09-04 ENCOUNTER — Telehealth: Payer: Self-pay | Admitting: Internal Medicine

## 2021-09-04 NOTE — Telephone Encounter (Signed)
Pt called stating that he has a bill with [facility] for the wrong insurance being filed from lab work we ordered. He states he has tried several times to get in touch with [facility] Billing and is unable to reach them. He is wanting to know if you can please get in touch with him to see if there is anything we can do about it being refiled through the correct insurance that we do have on file?

## 2021-09-19 ENCOUNTER — Other Ambulatory Visit: Payer: Self-pay | Admitting: Internal Medicine

## 2021-09-19 DIAGNOSIS — E782 Mixed hyperlipidemia: Secondary | ICD-10-CM

## 2021-09-21 ENCOUNTER — Institutional Professional Consult (permissible substitution): Payer: 59 | Admitting: Pulmonary Disease

## 2021-10-26 ENCOUNTER — Ambulatory Visit (INDEPENDENT_AMBULATORY_CARE_PROVIDER_SITE_OTHER): Payer: 59 | Admitting: Pulmonary Disease

## 2021-10-26 ENCOUNTER — Encounter: Payer: Self-pay | Admitting: Pulmonary Disease

## 2021-10-26 VITALS — BP 138/78 | HR 70 | Temp 98.7°F | Ht 73.0 in | Wt 270.0 lb

## 2021-10-26 DIAGNOSIS — G4733 Obstructive sleep apnea (adult) (pediatric): Secondary | ICD-10-CM | POA: Diagnosis not present

## 2021-10-26 NOTE — Patient Instructions (Signed)
Will have you sign a release form to get copies of your sleep studies from [facility]  Call in August when you change your insurance plan, and then we will send an order to get you set up with a new CPAP supply company  Follow up in 6 months

## 2021-10-26 NOTE — Progress Notes (Signed)
[facility]  Chief Complaint  Patient presents with   Consult    Sleep Consult  patient already on CPAP     Past Surgical History:  He  has a past surgical history that includes Eye surgery; Shoulder surgery; Abdominal surgery; Appendectomy; elbow surgery; Foot surgery (Bilateral); Shoulder arthroscopy with open rotator cuff repair (Right, 06/24/2018); and Vasectomy (N/A).  Past Medical History:  Anxiety, Arthritis, Bipolar 1, Bulimia nervosa, GERD, HLD, Nephrolithiasis, HTN, Melanoma, Spinal stenosis  Constitutional:  BP 138/78 (BP Location: Left Arm, Patient Position: Sitting)   Pulse 70   Temp 98.7 F (37.1 C) (Temporal)   Ht '6\' 1"'$  (1.854 m)   Wt 270 lb (122.5 kg)   SpO2 97% Comment: ra  BMI 35.62 kg/m   Brief Summary:  Calvin Cole is a 66 y.o. male with obstructive sleep apnea.  He works as a Civil engineer, contracting.      Subjective:   He was living in the [place] area.  He had a sleep study with [facility] in 2016 and found to have sleep apnea.  He was started on CPAP then.  His supply company went out of business and he wasn't set up with an alternative.  He has been purchasing supplies on his own.  He uses CPAP nightly.  He has nasal mask.  Sleeps well with CPAP.  She snores and wakes up choking if he falls asleep without CPAP on.  He goes to sleep at 9 pm.  He falls asleep in 15 minutes.  He wakes up to shift position because of back pain.  He is followed by orthopedics in [place], [place] because of his insurance.  He will likely need back surgery for L3-4 spinal stenosis.  He gets out of bed between 5 and 7 am.  He feels rested in the morning.  He denies morning headache.  He does not use anything to help him fall sleep or stay awake.  He denies sleep walking, sleep talking, bruxism, or nightmares.  There is no history of restless legs.  He denies sleep hallucinations, sleep paralysis, or cataplexy.  The Epworth score is 8 out of  24.   Physical Exam:   Appearance - well kempt   ENMT - no sinus tenderness, no oral exudate, no LAN, Mallampati 4 airway, no stridor  Respiratory - equal breath sounds bilaterally, no wheezing or rales  CV - s1s2 regular rate and rhythm, no murmurs  Ext - no clubbing, no edema  Skin - no rashes  Psych - normal mood and affect   Sleep Tests:  Auto CPAP 07/28/21 to 10/25/21 >> used on 88 of 90 nights with average 7 hrs 55 min.  Average AHI 1.2 with median CPAP 8 and 95 th percentile CPAP 11 cm H2O  Social History:  He  reports that he has never smoked. He has never used smokeless tobacco. He reports current alcohol use. He reports current drug use. Drug: Marijuana.  Family History:  His family history includes Cancer in his father and mother; Clotting disorder in his brother; Dementia in his sister.     Assessment/Plan:   Obstructive sleep apnea. - he is compliant with CPAP and reports benefit from therapy - his previous DME is [employer] went out of business several years ago, and he has been purchasing supplies on his own - he would like to get set up with a local supply company - will get copy of his sleep studies from [facility],  and then arrange for a new DME - since his current CPAP is more than 64 yrs old, he will need a new Resmed auto CPAP at 5 to 20 cm H2O - explained he might need repeat sleep study prior to getting new CPAP machine if this is required by his insurance - discussed how sleep apnea can impact his health - reviewed importance of weight loss - reviewed alternative treatment options for sleep apnea - discussed the concerns for ozone exposure from using a SoClean device  Lumbar spinal stenosis. - due to his insurance, he is followed by orthopedics in [place], [place] - he might need surgical intervention for this - there wouldn't be any pulmonary contraindications for him to have surgery  Time Spent Involved in Patient Care on Day of Examination:  36  minutes  Follow up:   Patient Instructions  Will have you sign a release form to get copies of your sleep studies from [facility]  Call in August when you change your insurance plan, and then we will send an order to get you set up with a new CPAP supply company  Follow up in 6 months  Medication List:   Allergies as of 10/26/2021       Reactions   Sulfa Antibiotics Other (See Comments)   fever        Medication List        Accurate as of October 26, 2021  9:29 AM. If you have any questions, ask your nurse or doctor.          amLODipine 5 MG tablet Commonly known as: NORVASC Take 1 tablet (5 mg total) by mouth daily.   aspirin EC 81 MG tablet Take 81 mg by mouth daily.   atorvastatin 40 MG tablet Commonly known as: LIPITOR TAKE 1 TABLET(40 MG) BY MOUTH EVERY EVENING   buPROPion 150 MG 24 hr tablet Commonly known as: WELLBUTRIN XL Take 1 tablet (150 mg total) by mouth daily.   carbamazepine 200 MG tablet Commonly known as: TEGRETOL Take 3 tablets (600 mg total) by mouth daily AND 2 tablets (400 mg total) every evening.   carbamide peroxide 6.5 % OTIC solution Commonly known as: DEBROX Place 5 drops into both ears 2 (two) times daily.   escitalopram 20 MG tablet Commonly known as: Lexapro Take 1 tablet (20 mg total) by mouth daily.   losartan 100 MG tablet Commonly known as: COZAAR Take 1 tablet (100 mg total) by mouth daily.   multivitamin Tabs tablet Take 1 tablet by mouth daily. Patient should resume this as a home medication. No prescription provided at discharge.   pantoprazole 40 MG tablet Commonly known as: PROTONIX TAKE 1 TABLET(40 MG) BY MOUTH DAILY   sodium chloride 0.65 % Soln nasal spray Commonly known as: OCEAN Place 1 spray into both nostrils as needed for congestion.   tadalafil 5 MG tablet Commonly known as: CIALIS Take 1 tablet (5 mg total) by mouth daily as needed for erectile dysfunction.        Signature:  Chesley Mires,  MD [facility] Pager - [phone] 10/26/2021, 9:29 AM
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
## 2021-11-09 ENCOUNTER — Other Ambulatory Visit: Payer: Self-pay | Admitting: Psychiatry

## 2021-11-09 NOTE — Addendum Note (Signed)
Addended by: [name] on: 11/09/2021 01:44 PM   Modules accepted: Orders, Level of Service

## 2021-11-09 NOTE — Telephone Encounter (Addendum)
This encounter was created in error - please disregard.  This encounter was created in error - please disregard.

## 2021-11-10 ENCOUNTER — Telehealth (INDEPENDENT_AMBULATORY_CARE_PROVIDER_SITE_OTHER): Payer: 59 | Admitting: Psychiatry

## 2021-11-10 ENCOUNTER — Encounter: Payer: Self-pay | Admitting: Psychiatry

## 2021-11-10 DIAGNOSIS — F319 Bipolar disorder, unspecified: Secondary | ICD-10-CM

## 2021-11-10 DIAGNOSIS — F411 Generalized anxiety disorder: Secondary | ICD-10-CM

## 2021-11-10 DIAGNOSIS — F5089 Other specified eating disorder: Secondary | ICD-10-CM

## 2021-11-10 MED ORDER — CARBAMAZEPINE 200 MG PO TABS: ORAL_TABLET | ORAL | 1 refills | Status: DC

## 2021-11-10 NOTE — Patient Instructions (Signed)
Continue carbamazepine 600 mg AM, 400 mg PM  Continue bupropion 150 mg daily  Continue lexapro 20 mg daily  Next appointment: 9/15 at 9:30

## 2021-11-21 DIAGNOSIS — M48062 Spinal stenosis, lumbar region with neurogenic claudication: Secondary | ICD-10-CM | POA: Insufficient documentation

## 2021-11-21 DIAGNOSIS — G8929 Other chronic pain: Secondary | ICD-10-CM | POA: Insufficient documentation

## 2021-11-21 DIAGNOSIS — M5416 Radiculopathy, lumbar region: Secondary | ICD-10-CM | POA: Insufficient documentation

## 2021-11-22 ENCOUNTER — Telehealth: Payer: 59 | Admitting: Psychiatry

## 2021-11-24 ENCOUNTER — Encounter: Payer: Self-pay | Admitting: Internal Medicine

## 2021-11-28 ENCOUNTER — Ambulatory Visit: Payer: 59 | Admitting: Internal Medicine

## 2021-12-29 ENCOUNTER — Telehealth: Payer: 59 | Admitting: Psychiatry

## 2022-01-23 ENCOUNTER — Encounter: Payer: Self-pay | Admitting: Internal Medicine

## 2022-01-23 ENCOUNTER — Ambulatory Visit (INDEPENDENT_AMBULATORY_CARE_PROVIDER_SITE_OTHER): Payer: BC Managed Care – PPO | Admitting: Internal Medicine

## 2022-01-23 VITALS — Ht 73.0 in | Wt 269.0 lb

## 2022-01-23 DIAGNOSIS — U071 COVID-19: Secondary | ICD-10-CM | POA: Diagnosis not present

## 2022-01-23 DIAGNOSIS — J1282 Pneumonia due to coronavirus disease 2019: Secondary | ICD-10-CM | POA: Diagnosis not present

## 2022-01-23 MED ORDER — PREDNISONE 20 MG PO TABS: 40.0000 mg | ORAL_TABLET | Freq: Every day | ORAL | 0 refills | Status: AC

## 2022-01-23 NOTE — Patient Instructions (Signed)
Thank you for trusting me with your care. To recap, today we discussed the following:   Covid -19 Pneumonia Symptoms and positive test suggest Covid-19 Pneumonia. You continue to have moderate symptoms on day 6 and I am sending 5 day course of prednisone to your pharmacy.   - predniSONE (DELTASONE) 20 MG tablet; Take 2 tablets (40 mg total) by mouth daily with breakfast for 5 days.  Dispense: 10 tablet; Refill: 0

## 2022-01-23 NOTE — Assessment & Plan Note (Signed)
Patient traveled to [facility] last week and feeling unwell on his return flight. His first symptoms began on Thursday and this is day 6 of symptoms. He has congestion, ear aches and cough. He is using OTC medication for symptoms and staying hydrated. He tested positive for Covid on Sunday. He is improving , but continues to feel very congested and bothered by cough.  Assessment/Plan: Covid-19 Pneumonia. Patient had positive Covid test and symptoms consistent with Covid-19 Pneumonia. He has had vaccinations and boosters for Covid-19. Given continued moderate symptoms and Day 6 of illness I will prescribe 5 day course of steroids.Discussed wearing mask at least 5 days after symptoms have resolved.  - predniSONE (DELTASONE) 20 MG tablet; Take 2 tablets (40 mg total) by mouth daily with breakfast for 5 days.  Dispense: 10 tablet; Refill: 0

## 2022-01-23 NOTE — Progress Notes (Signed)
   Acute Office Visit  CC: Covid-19  This is a telephone encounter between Calvin Cole and Calvin Cole on 01/23/2022 for congestion, ear aches, and cough. The visit was conducted with the patient located at home and Calvin Cole at [facility]. The patient's identity was confirmed using their DOB and current address. The patient has consented to being evaluated through a telephone encounter and understands the associated risks (an examination cannot be done and the patient may need to come in for an appointment) / benefits (allows the patient to remain at home, decreasing exposure to coronavirus).    HPI:[name] is a 66 y.o. male who presents for evaluation of acute illness and positive home Covid test. For the details of today's visit, please refer to the assessment and plan.   Past Medical History:  Diagnosis Date   Achilles tendinitis 05/23/2020   Anxiety    Arthritis    Bipolar 1 disorder (HCC)    Bulimia nervosa    GERD (gastroesophageal reflux disease)    High cholesterol    History of kidney stones    H/O   History of methicillin resistant staphylococcus aureus (MRSA) 2007   Hypertension    Melanoma ([place])    [facility]/Dr. Clark/followed by Dr. Leotis Shames   Postoperative seroma 04/13/2014   Sleep apnea    Spinal stenosis of lumbar region     Review of Systems:    Review of Systems  Constitutional:  Positive for fever and malaise/fatigue.  HENT:  Positive for congestion, ear pain and sinus pain.   Respiratory:  Positive for cough and sputum production.      Physical Exam: Vitals:   01/23/22 1451  Weight: 269 lb (122 kg)  Height: '6\' 1"'$  (1.854 m)      Assessment & Plan:   Pneumonia due to COVID-19 virus Patient traveled to [facility] last week and feeling unwell on his return flight. His first symptoms began on Thursday and this is day 6 of symptoms. He has congestion, ear aches and cough. He is using OTC medication for symptoms and staying hydrated. He tested  positive for Covid on Sunday. He is improving , but continues to feel very congested and bothered by cough.  Assessment/Plan: Covid-19 Pneumonia. Patient had positive Covid test and symptoms consistent with Covid-19 Pneumonia. He has had vaccinations and boosters for Covid-19. Given continued moderate symptoms and Day 6 of illness I will prescribe 5 day course of steroids.Discussed wearing mask at least 5 days after symptoms have resolved.  - predniSONE (DELTASONE) 20 MG tablet; Take 2 tablets (40 mg total) by mouth daily with breakfast for 5 days.  Dispense: 10 tablet; Refill: 0    Calvin Dy, MD
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
## 2022-02-01 ENCOUNTER — Ambulatory Visit: Payer: BC Managed Care – PPO | Admitting: Internal Medicine

## 2022-02-01 ENCOUNTER — Telehealth (INDEPENDENT_AMBULATORY_CARE_PROVIDER_SITE_OTHER): Payer: BC Managed Care – PPO | Admitting: Psychiatry

## 2022-02-01 ENCOUNTER — Encounter: Payer: Self-pay | Admitting: Internal Medicine

## 2022-02-01 ENCOUNTER — Encounter: Payer: Self-pay | Admitting: Psychiatry

## 2022-02-01 VITALS — BP 122/68 | HR 89 | Ht 74.0 in | Wt 276.0 lb

## 2022-02-01 DIAGNOSIS — F502 Bulimia nervosa: Secondary | ICD-10-CM

## 2022-02-01 DIAGNOSIS — E782 Mixed hyperlipidemia: Secondary | ICD-10-CM

## 2022-02-01 DIAGNOSIS — F319 Bipolar disorder, unspecified: Secondary | ICD-10-CM | POA: Diagnosis not present

## 2022-02-01 DIAGNOSIS — Z981 Arthrodesis status: Secondary | ICD-10-CM

## 2022-02-01 DIAGNOSIS — I10 Essential (primary) hypertension: Secondary | ICD-10-CM | POA: Diagnosis not present

## 2022-02-01 DIAGNOSIS — G4733 Obstructive sleep apnea (adult) (pediatric): Secondary | ICD-10-CM

## 2022-02-01 DIAGNOSIS — Z23 Encounter for immunization: Secondary | ICD-10-CM | POA: Diagnosis not present

## 2022-02-01 DIAGNOSIS — M720 Palmar fascial fibromatosis [Dupuytren]: Secondary | ICD-10-CM

## 2022-02-01 DIAGNOSIS — F411 Generalized anxiety disorder: Secondary | ICD-10-CM

## 2022-02-01 MED ORDER — BUPROPION HCL ER (XL) 150 MG PO TB24
150.0000 mg | ORAL_TABLET | Freq: Every day | ORAL | 1 refills | Status: DC
Start: 2022-02-01 — End: 2022-07-20

## 2022-02-01 MED ORDER — ESCITALOPRAM OXALATE 20 MG PO TABS: 20.0000 mg | ORAL_TABLET | Freq: Every day | ORAL | 1 refills | Status: DC

## 2022-02-01 NOTE — Assessment & Plan Note (Addendum)
Lipid profile reviewed Continue Atorvastatin 40 mg qHS, advised for compliance

## 2022-02-01 NOTE — Assessment & Plan Note (Addendum)
Has contracture near the ring finger Referred to hand surgery

## 2022-02-01 NOTE — Assessment & Plan Note (Signed)
Had chronic low back pain, likely from DDD of lumbar spine Had lumbar fusion in [place] Back pain improved now

## 2022-02-01 NOTE — Assessment & Plan Note (Signed)
On Carbamazepine, Lexapro and Wellbutrin Follows up with Psychiatrist

## 2022-02-01 NOTE — Patient Instructions (Signed)
Please continue taking medications as prescribed.  Please continue to follow low carb diet and perform moderate exercise/walking at least 150 mins/week. 

## 2022-02-01 NOTE — Patient Instructions (Signed)
Continue carbamazepine 600 mg AM, 400 mg PM  Continue bupropion 150 mg daily - he declined a refill Continue lexapro 20 mg daily  He was advised to obtain blood test (CBC, CMP) Next appointment: 1/10 at 2 PM
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
## 2022-02-01 NOTE — Assessment & Plan Note (Signed)
BP Readings from Last 1 Encounters:  02/01/22 122/68   Well-controlled with Amlodipine and Losartan Counseled for compliance with the medications Advised DASH diet and moderate exercise/walking, at least 150 mins/week

## 2022-02-01 NOTE — Assessment & Plan Note (Signed)
Uses CPAP regularly, continues to benefit from it 

## 2022-02-03 LAB — CMP14+EGFR
ALT: 45 IU/L — ABNORMAL HIGH (ref 0–44)
AST: 22 IU/L (ref 0–40)
Albumin/Globulin Ratio: 1.9 (ref 1.2–2.2)
Albumin: 4.4 g/dL (ref 3.9–4.9)
Alkaline Phosphatase: 76 IU/L (ref 44–121)
BUN/Creatinine Ratio: 16 (ref 10–24)
BUN: 15 mg/dL (ref 8–27)
Bilirubin Total: <0.2 mg/dL (ref 0.0–1.2)
CO2: 22 mmol/L (ref 20–29)
Calcium: 9.4 mg/dL (ref 8.6–10.2)
Chloride: 105 mmol/L (ref 96–106)
Creatinine, Ser: 0.96 mg/dL (ref 0.76–1.27)
Globulin, Total: 2.3 g/dL (ref 1.5–4.5)
Glucose: 89 mg/dL (ref 70–99)
Potassium: 4.5 mmol/L (ref 3.5–5.2)
Sodium: 146 mmol/L — ABNORMAL HIGH (ref 134–144)
Total Protein: 6.7 g/dL (ref 6.0–8.5)
eGFR: 89 mL/min/{1.73_m2} (ref >59)

## 2022-02-03 LAB — CBC WITH DIFFERENTIAL/PLATELET
Basophils Absolute: 0.1 10*3/uL (ref 0.0–0.2)
Basos: 1 %
EOS (ABSOLUTE): 0.4 10*3/uL (ref 0.0–0.4)
Eos: 4 %
Hematocrit: 43.4 % (ref 37.5–51.0)
Hemoglobin: 14.1 g/dL (ref 13.0–17.7)
Immature Grans (Abs): 0 10*3/uL (ref 0.0–0.1)
Immature Granulocytes: 0 %
Lymphocytes Absolute: 2 10*3/uL (ref 0.7–3.1)
Lymphs: 20 %
MCH: 32.1 pg (ref 26.6–33.0)
MCHC: 32.5 g/dL (ref 31.5–35.7)
MCV: 99 fL — ABNORMAL HIGH (ref 79–97)
Monocytes Absolute: 0.9 10*3/uL (ref 0.1–0.9)
Monocytes: 8 %
Neutrophils Absolute: 6.9 10*3/uL (ref 1.4–7.0)
Neutrophils: 67 %
Platelets: 271 10*3/uL (ref 150–450)
RBC: 4.39 x10E6/uL (ref 4.14–5.80)
RDW: 12.2 % (ref 11.6–15.4)
WBC: 10.3 10*3/uL (ref 3.4–10.8)

## 2022-02-05 ENCOUNTER — Telehealth: Payer: Self-pay | Admitting: Internal Medicine

## 2022-02-05 NOTE — Telephone Encounter (Signed)
LVM for pt to call the office.

## 2022-02-05 NOTE — Telephone Encounter (Signed)
Pt returning call for labs 

## 2022-02-21 ENCOUNTER — Telehealth: Payer: Self-pay | Admitting: Internal Medicine

## 2022-02-21 NOTE — Telephone Encounter (Signed)
Called in regard to ENT referral   Patient seen hearing provider and they states that patient had L ear Bulging and is recommending that patient see ENT.  Patient wants a cll back in regard

## 2022-02-22 ENCOUNTER — Other Ambulatory Visit: Payer: Self-pay

## 2022-02-22 ENCOUNTER — Telehealth: Payer: Self-pay | Admitting: Pulmonary Disease

## 2022-02-22 DIAGNOSIS — G4733 Obstructive sleep apnea (adult) (pediatric): Secondary | ICD-10-CM

## 2022-02-22 DIAGNOSIS — H9190 Unspecified hearing loss, unspecified ear: Secondary | ICD-10-CM

## 2022-02-22 NOTE — Telephone Encounter (Signed)
Referral placed.

## 2022-02-22 NOTE — Telephone Encounter (Signed)
Called and notified patient.. Nothing further needed at this time.

## 2022-02-22 NOTE — Telephone Encounter (Signed)
Perhaps we can try using the home sleep study report from [facility] done on 08/10/13 to get a new CPAP machine and new DME.  This sleep study showed an AHI of 15.2 and SpO2 low of 86%, consistent with moderate obstructive sleep apnea.  I have changed order to get a new DME and get a new [employer] auto CPAP machine.  He will need an ROV in 4 months.  We will call to schedule a home sleep study if needed by his insurance prior to getting a new CPAP machine.  If this isn't required by insurance, then he would not need another sleep study at this time.

## 2022-02-22 NOTE — Telephone Encounter (Signed)
Looks like we are still waiting for sleep study to come from [facility].  Resent Release info request to [facility].  Called and notified patient and he voiced understanding, he states he is agreeable to have a HST ordered and is aware they are a few weeks out.  Was able to find a HST report from [place] in care everywhere will leave the report in Dr. Juanetta Gosling look at folder for review on Monday.   Dr. Halford Chessman are you okay with [place] ordering a HST for patient in the meantime?

## 2022-02-28 ENCOUNTER — Telehealth: Payer: BC Managed Care – PPO | Admitting: Physician Assistant

## 2022-02-28 DIAGNOSIS — J0110 Acute frontal sinusitis, unspecified: Secondary | ICD-10-CM

## 2022-02-28 MED ORDER — LEVOCETIRIZINE DIHYDROCHLORIDE 5 MG PO TABS: 5.0000 mg | ORAL_TABLET | Freq: Every evening | ORAL | 0 refills | Status: AC

## 2022-02-28 MED ORDER — AMOXICILLIN-POT CLAVULANATE 875-125 MG PO TABS: 1.0000 | ORAL_TABLET | Freq: Two times a day (BID) | ORAL | 0 refills | Status: DC

## 2022-02-28 MED ORDER — FLUTICASONE PROPIONATE 50 MCG/ACT NA SUSP: 2.0000 | Freq: Every day | NASAL | 0 refills | Status: AC

## 2022-02-28 NOTE — Progress Notes (Signed)
Virtual Visit Consent   Calvin Cole, you are scheduled for a virtual visit with a [facility] provider today. Just as with appointments in the office, your consent must be obtained to participate. Your consent will be active for this visit and any virtual visit you may have with one of our providers in the next 365 days. If you have a MyChart account, a copy of this consent can be sent to you electronically.  As this is a virtual visit, video technology does not allow for your provider to perform a traditional examination. This may limit your provider's ability to fully assess your condition. If your provider identifies any concerns that need to be evaluated in person or the need to arrange testing (such as labs, EKG, etc.), we will make arrangements to do so. Although advances in technology are sophisticated, we cannot ensure that it will always work on either your end or our end. If the connection with a video visit is poor, the visit may have to be switched to a telephone visit. With either a video or telephone visit, we are not always able to ensure that we have a secure connection.  By engaging in this virtual visit, you consent to the provision of healthcare and authorize for your insurance to be billed (if applicable) for the services provided during this visit. Depending on your insurance coverage, you may receive a charge related to this service.  I need to obtain your verbal consent now. Are you willing to proceed with your visit today? Calvin Cole has provided verbal consent on 02/28/2022 for a virtual visit (video or telephone). [name], [place]  Date: 02/28/2022 1:36 PM  Virtual Visit via Video Note   I, [name], connected with  Calvin Cole  ([id number], 1956/03/01) on 02/28/22 at  1:15 PM EST by a video-enabled telemedicine application and verified that I am speaking with the correct person using two identifiers.  Location: Patient: Virtual Visit Location  Patient: Home Provider: Virtual Visit Location Provider: [place], [employer] NP student, attended and assisted with the visit for the full duration of the visit with verbal approval given from the patient.    I discussed the limitations of evaluation and management by telemedicine and the availability of in person appointments. The patient expressed understanding and agreed to proceed.    History of Present Illness: Calvin Cole is a 66 y.o. who identifies as a male who was assigned male at birth, and is being seen today for 8 or so days of nasal congestion, sinus congestion with pressure and nasal discharge. Also with some  dry cough. Initially with sore throat that has resolved. Denies fever, chills or aches. COVID tested x 2 and negative. Has been taking OTC Tylenol and Sudafed. Is worried about lingering symptoms.  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   Patellar tendonitis 05/23/2020   Mixed hyperlipidemia 03/19/2017   Essential hypertension 03/19/2017   Bipolar I disorder ([place]) 12/06/2016   Bulimia 12/06/2016   Personal history of malignant melanoma of skin 01/17/2015   Obesity 09/25/2013   Meralgia paresthetica of right  side 05/27/2013   Moderate obstructive sleep apnea 05/27/2013    Allergies:  Allergies  Allergen Reactions   Sulfa Antibiotics Other (See Comments)    fever   Medications:  Current Outpatient Medications:    amoxicillin-clavulanate (AUGMENTIN) 875-125 MG tablet, Take 1 tablet by mouth 2 (two) times daily., Disp: 14 tablet, Rfl: 0   fluticasone (FLONASE) 50  MCG/ACT nasal spray, Place 2 sprays into both nostrils daily., Disp: 16 g, Rfl: 0   levocetirizine (XYZAL) 5 MG tablet, Take 1 tablet (5 mg total) by mouth every evening., Disp: 30 tablet, Rfl: 0   amLODipine (NORVASC) 5 MG tablet, Take 1 tablet (5 mg total) by mouth daily., Disp: 90 tablet, Rfl: 3   aspirin EC 81 MG tablet, Take 81 mg by mouth daily., Disp: , Rfl:    atorvastatin (LIPITOR) 40 MG tablet, TAKE 1 TABLET(40 MG) BY MOUTH EVERY EVENING, Disp: 90 tablet, Rfl: 1   buPROPion (WELLBUTRIN XL) 150 MG 24 hr tablet, Take 1 tablet (150 mg total) by mouth daily., Disp: 90 tablet, Rfl: 1   carbamazepine (TEGRETOL) 200 MG tablet, Take 3 tablets (600 mg total) by mouth daily AND 2 tablets (400 mg total) every evening., Disp: 450 tablet, Rfl: 1   [START ON 03/15/2022] escitalopram (LEXAPRO) 20 MG tablet, Take 1 tablet (20 mg total) by mouth daily., Disp: 90 tablet, Rfl: 1   losartan (COZAAR) 100 MG tablet, Take 1 tablet (100 mg total) by mouth daily., Disp: 90 tablet, Rfl: 3   multivitamin (PROSIGHT) TABS tablet, Take 1 tablet by mouth daily. Patient should resume this as a home medication. No prescription provided at discharge., Disp: 30 each, Rfl: 0   pantoprazole (PROTONIX) 40 MG tablet, TAKE 1 TABLET(40 MG) BY MOUTH DAILY, Disp: 90 tablet, Rfl: 1   sodium chloride (OCEAN) 0.65 % SOLN nasal spray, Place 1 spray into both nostrils as needed for congestion., Disp: 15 mL, Rfl: 0   tadalafil (CIALIS) 5 MG tablet, Take 1 tablet (5 mg total) by mouth daily as needed for erectile dysfunction., Disp: 30 tablet, Rfl: 1  Observations/Objective: Patient is well-developed, well-nourished in no acute distress.  Resting comfortably at home.  Head is normocephalic, atraumatic.  No labored breathing. Speech is clear and coherent with logical content.  Patient is alert and oriented at baseline.   Assessment and Plan: 1. Acute non-recurrent frontal sinusitis - fluticasone (FLONASE) 50 MCG/ACT nasal spray;  Place 2 sprays into both nostrils daily.  Dispense: 16 g; Refill: 0 - levocetirizine (XYZAL) 5 MG tablet; Take 1 tablet (5 mg total) by mouth every evening.  Dispense: 30 tablet; Refill: 0 - amoxicillin-clavulanate (AUGMENTIN) 875-125 MG tablet; Take 1 tablet by mouth 2 (two) times daily.  Dispense: 14 tablet; Refill: 0  Rx Flonase and Xyzal.  Increase fluids.  Rest.  Saline nasal spray.  Probiotic.  Mucinex as directed.  Humidifier in bedroom. Ok to continue Sudafed. If not improving over next 48 hours or anything continuing to worsen, he is to take the Augmenting placed on file at the pharmacy as directed.  Call or return to clinic if symptoms are not improving.   Follow Up Instructions: I discussed the assessment and treatment plan with the patient. The patient was provided an opportunity to ask questions and all were answered. The patient agreed with the plan and demonstrated an understanding of the instructions.  A copy of instructions were sent to the patient via MyChart unless otherwise noted below.   The patient was advised to  call back or seek an in-person evaluation if the symptoms worsen or if the condition fails to improve as anticipated.  Time:  I spent 10 minutes with the patient via telehealth technology discussing the above problems/concerns.    [name], PA-C

## 2022-02-28 NOTE — Patient Instructions (Signed)
Calvin Cole, thank you for joining Calvin Rio, PA-C for today's virtual visit.  While this provider is not your primary care provider (PCP), if your PCP is located in our provider database this encounter information will be shared with them immediately following your visit.   [facility] account gives you access to today's visit and all your visits, tests, and labs performed at [facility] " click here if you don't have a [facility] account or go to [email or web address]  Consent: (Patient) Calvin Cole provided verbal consent for this virtual visit at the beginning of the encounter.  Current Medications:  Current Outpatient Medications:    amLODipine (NORVASC) 5 MG tablet, Take 1 tablet (5 mg total) by mouth daily., Disp: 90 tablet, Rfl: 3   aspirin EC 81 MG tablet, Take 81 mg by mouth daily., Disp: , Rfl:    atorvastatin (LIPITOR) 40 MG tablet, TAKE 1 TABLET(40 MG) BY MOUTH EVERY EVENING, Disp: 90 tablet, Rfl: 1   buPROPion (WELLBUTRIN XL) 150 MG 24 hr tablet, Take 1 tablet (150 mg total) by mouth daily., Disp: 90 tablet, Rfl: 1   carbamazepine (TEGRETOL) 200 MG tablet, Take 3 tablets (600 mg total) by mouth daily AND 2 tablets (400 mg total) every evening., Disp: 450 tablet, Rfl: 1   [START ON 03/15/2022] escitalopram (LEXAPRO) 20 MG tablet, Take 1 tablet (20 mg total) by mouth daily., Disp: 90 tablet, Rfl: 1   losartan (COZAAR) 100 MG tablet, Take 1 tablet (100 mg total) by mouth daily., Disp: 90 tablet, Rfl: 3   multivitamin (PROSIGHT) TABS tablet, Take 1 tablet by mouth daily. Patient should resume this as a home medication. No prescription provided at discharge., Disp: 30 each, Rfl: 0   pantoprazole (PROTONIX) 40 MG tablet, TAKE 1 TABLET(40 MG) BY MOUTH DAILY, Disp: 90 tablet, Rfl: 1   sodium chloride (OCEAN) 0.65 % SOLN nasal spray, Place 1 spray into both nostrils as needed for congestion., Disp: 15 mL, Rfl: 0   tadalafil (CIALIS) 5 MG  tablet, Take 1 tablet (5 mg total) by mouth daily as needed for erectile dysfunction., Disp: 30 tablet, Rfl: 1   Medications ordered in this encounter:  No orders of the defined types were placed in this encounter.    *If you need refills on other medications prior to your next appointment, please contact your pharmacy*  Follow-Up: Call back or seek an in-person evaluation if the symptoms worsen or if the condition fails to improve as anticipated.  [facility] [phone]  Other Instructions Please take antibiotic as directed.  Increase fluid intake.  Use Saline nasal spray.  Take a daily multivitamin. Use the Flonase and Xyzal as directed.  Place a humidifier in the bedroom.   If not improving over next 48 hours, add on the Augmentin and take as directed, with food.   Sinusitis Sinusitis is redness, soreness, and swelling (inflammation) of the paranasal sinuses. Paranasal sinuses are air pockets within the bones of your face (beneath the eyes, the middle of the forehead, or above the eyes). In healthy paranasal sinuses, mucus is able to drain out, and air is able to circulate through them by way of your nose. However, when your paranasal sinuses are inflamed, mucus and air can become trapped. This can allow bacteria and other germs to grow and cause infection. Sinusitis can develop quickly and last only a short time (acute) or continue over a long period (chronic). Sinusitis that lasts for  more than 12 weeks is considered chronic.  CAUSES  Causes of sinusitis include: Allergies. Structural abnormalities, such as displacement of the cartilage that separates your nostrils (deviated septum), which can decrease the air flow through your nose and sinuses and affect sinus drainage. Functional abnormalities, such as when the small hairs (cilia) that line your sinuses and help remove mucus do not work properly or are not present. SYMPTOMS  Symptoms of acute and chronic sinusitis  are the same. The primary symptoms are pain and pressure around the affected sinuses. Other symptoms include: Upper toothache. Earache. Headache. Bad breath. Decreased sense of smell and taste. A cough, which worsens when you are lying flat. Fatigue. Fever. Thick drainage from your nose, which often is green and may contain pus (purulent). Swelling and warmth over the affected sinuses. DIAGNOSIS  Your caregiver will perform a physical exam. During the exam, your caregiver may: Look in your nose for signs of abnormal growths in your nostrils (nasal polyps). Tap over the affected sinus to check for signs of infection. View the inside of your sinuses (endoscopy) with a special imaging device with a light attached (endoscope), which is inserted into your sinuses. If your caregiver suspects that you have chronic sinusitis, one or more of the following tests may be recommended: Allergy tests. Nasal culture A sample of mucus is taken from your nose and sent to a lab and screened for bacteria. Nasal cytology A sample of mucus is taken from your nose and examined by your caregiver to determine if your sinusitis is related to an allergy. TREATMENT  Most cases of acute sinusitis are related to a viral infection and will resolve on their own within 10 days. Sometimes medicines are prescribed to help relieve symptoms (pain medicine, decongestants, nasal steroid sprays, or saline sprays).  However, for sinusitis related to a bacterial infection, your caregiver will prescribe antibiotic medicines. These are medicines that will help kill the bacteria causing the infection.  Rarely, sinusitis is caused by a fungal infection. In theses cases, your caregiver will prescribe antifungal medicine. For some cases of chronic sinusitis, surgery is needed. Generally, these are cases in which sinusitis recurs more than 3 times per year, despite other treatments. HOME CARE INSTRUCTIONS  Drink plenty of water. Water  helps thin the mucus so your sinuses can drain more easily. Use a humidifier. Inhale steam 3 to 4 times a day (for example, sit in the bathroom with the shower running). Apply a warm, moist washcloth to your face 3 to 4 times a day, or as directed by your caregiver. Use saline nasal sprays to help moisten and clean your sinuses. Take over-the-counter or prescription medicines for pain, discomfort, or fever only as directed by your caregiver. SEEK IMMEDIATE MEDICAL CARE IF: You have increasing pain or severe headaches. You have nausea, vomiting, or drowsiness. You have swelling around your face. You have vision problems. You have a stiff neck. You have difficulty breathing. MAKE SURE YOU:  Understand these instructions. Will watch your condition. Will get help right away if you are not doing well or get worse. Document Released: 04/02/2005 Document Revised: 06/25/2011 Document Reviewed: 04/17/2011 [facility] Patient Information 2014 [place], [place].    If you have been instructed to have an in-person evaluation today at a local Urgent Care facility, please use the link below. It will take you to a list of all of our available [facility], including address, phone number and hours of operation. Please do not delay care.  [facility]  If you or a family member do not have a primary care provider, use the link below to schedule a visit and establish care. When you choose a [facility] primary care physician or advanced practice provider, you gain a long-term partner in health. Find a Primary Care Provider  Learn more about [facility]'s in-office and virtual care options: [facility]

## 2022-03-15 ENCOUNTER — Other Ambulatory Visit: Payer: Self-pay | Admitting: Internal Medicine

## 2022-03-15 DIAGNOSIS — K219 Gastro-esophageal reflux disease without esophagitis: Secondary | ICD-10-CM

## 2022-03-18 ENCOUNTER — Other Ambulatory Visit: Payer: Self-pay | Admitting: Psychiatry

## 2022-03-18 DIAGNOSIS — F319 Bipolar disorder, unspecified: Secondary | ICD-10-CM
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
## 2022-04-24 ENCOUNTER — Ambulatory Visit: Payer: BC Managed Care – PPO | Admitting: Pulmonary Disease

## 2022-04-25 ENCOUNTER — Encounter: Payer: Self-pay | Admitting: Psychiatry

## 2022-04-25 ENCOUNTER — Telehealth (INDEPENDENT_AMBULATORY_CARE_PROVIDER_SITE_OTHER): Payer: BC Managed Care – PPO | Admitting: Psychiatry

## 2022-04-25 DIAGNOSIS — F502 Bulimia nervosa: Secondary | ICD-10-CM

## 2022-04-25 DIAGNOSIS — F319 Bipolar disorder, unspecified: Secondary | ICD-10-CM | POA: Diagnosis not present

## 2022-04-25 MED ORDER — CARBAMAZEPINE 200 MG PO TABS: ORAL_TABLET | ORAL | 1 refills | Status: DC

## 2022-05-30 ENCOUNTER — Encounter: Payer: BC Managed Care – PPO | Admitting: Internal Medicine

## 2022-06-02 ENCOUNTER — Other Ambulatory Visit: Payer: Self-pay | Admitting: Psychiatry

## 2022-06-02 ENCOUNTER — Other Ambulatory Visit: Payer: Self-pay | Admitting: Internal Medicine

## 2022-06-02 DIAGNOSIS — I10 Essential (primary) hypertension: Secondary | ICD-10-CM

## 2022-06-02 DIAGNOSIS — E782 Mixed hyperlipidemia: Secondary | ICD-10-CM
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
## 2022-06-07 ENCOUNTER — Encounter: Payer: BC Managed Care – PPO | Admitting: Internal Medicine

## 2022-06-08 ENCOUNTER — Telehealth: Payer: BC Managed Care – PPO | Admitting: Psychiatry

## 2022-07-16 ENCOUNTER — Telehealth: Payer: Self-pay | Admitting: Internal Medicine

## 2022-07-16 ENCOUNTER — Other Ambulatory Visit: Payer: Self-pay

## 2022-07-16 NOTE — Telephone Encounter (Signed)
Pt called stating he is wanting a referral to Neurology. States he is having pain when walking. Can you please refer???
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
## 2022-07-19 ENCOUNTER — Telehealth: Payer: BC Managed Care – PPO | Admitting: Internal Medicine

## 2022-07-19 ENCOUNTER — Encounter: Payer: Self-pay | Admitting: Internal Medicine

## 2022-07-19 DIAGNOSIS — F502 Bulimia nervosa: Secondary | ICD-10-CM

## 2022-07-19 DIAGNOSIS — I10 Essential (primary) hypertension: Secondary | ICD-10-CM

## 2022-07-19 DIAGNOSIS — E559 Vitamin D deficiency, unspecified: Secondary | ICD-10-CM

## 2022-07-19 DIAGNOSIS — R7303 Prediabetes: Secondary | ICD-10-CM

## 2022-07-19 DIAGNOSIS — Z125 Encounter for screening for malignant neoplasm of prostate: Secondary | ICD-10-CM

## 2022-07-19 DIAGNOSIS — F319 Bipolar disorder, unspecified: Secondary | ICD-10-CM

## 2022-07-19 DIAGNOSIS — E782 Mixed hyperlipidemia: Secondary | ICD-10-CM

## 2022-07-19 DIAGNOSIS — K219 Gastro-esophageal reflux disease without esophagitis: Secondary | ICD-10-CM

## 2022-07-19 MED ORDER — PANTOPRAZOLE SODIUM 40 MG PO TBEC: 40.0000 mg | DELAYED_RELEASE_TABLET | Freq: Every day | ORAL | 1 refills | Status: DC

## 2022-07-19 MED ORDER — LOSARTAN POTASSIUM 100 MG PO TABS: 100.0000 mg | ORAL_TABLET | Freq: Every day | ORAL | 3 refills | Status: DC

## 2022-07-19 MED ORDER — ATORVASTATIN CALCIUM 40 MG PO TABS: 40.0000 mg | ORAL_TABLET | Freq: Every day | ORAL | 1 refills | Status: DC

## 2022-07-19 MED ORDER — PHENTERMINE HCL 37.5 MG PO TABS: 37.5000 mg | ORAL_TABLET | Freq: Every day | ORAL | 1 refills | Status: DC

## 2022-07-19 MED ORDER — AMLODIPINE BESYLATE 5 MG PO TABS: 5.0000 mg | ORAL_TABLET | Freq: Every day | ORAL | 3 refills | Status: DC

## 2022-07-19 NOTE — Assessment & Plan Note (Signed)
BP Readings from Last 1 Encounters:  07/19/22 122/80   Well-controlled with Amlodipine and Losartan Counseled for compliance with the medications Advised DASH diet and moderate exercise/walking, at least 150 mins/week

## 2022-07-19 NOTE — Assessment & Plan Note (Signed)
On Carbamazepine, Lexapro and Wellbutrin Follows up with Psychiatrist 

## 2022-07-19 NOTE — Assessment & Plan Note (Signed)
BMI Readings from Last 3 Encounters:  07/19/22 36.68 kg/m  02/01/22 35.44 kg/m  01/23/22 35.49 kg/m   Associated with HTN, OSA, GERD and HLD Diet modification and moderate exercise advised Started phentermine 37.5 mg QD

## 2022-07-19 NOTE — Assessment & Plan Note (Signed)
Followed by psychiatry Advised to continue portion control Started phentermine for obesity
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
## 2022-07-19 NOTE — Patient Instructions (Signed)
Please start taking Phentermine as prescribed.  Please continue to follow low-carb diet and perform moderate exercise/walking at least 150 minutes/week.  Please get for fasting blood test done before the next visit.

## 2022-07-20 ENCOUNTER — Telehealth (INDEPENDENT_AMBULATORY_CARE_PROVIDER_SITE_OTHER): Payer: BC Managed Care – PPO | Admitting: Psychiatry

## 2022-07-20 ENCOUNTER — Encounter: Payer: Self-pay | Admitting: Psychiatry

## 2022-07-20 DIAGNOSIS — F502 Bulimia nervosa: Secondary | ICD-10-CM | POA: Diagnosis not present

## 2022-07-20 DIAGNOSIS — F411 Generalized anxiety disorder: Secondary | ICD-10-CM

## 2022-07-20 DIAGNOSIS — F319 Bipolar disorder, unspecified: Secondary | ICD-10-CM | POA: Diagnosis not present

## 2022-07-20 MED ORDER — BUPROPION HCL ER (XL) 150 MG PO TB24: 150.0000 mg | ORAL_TABLET | Freq: Every day | ORAL | 1 refills | Status: DC

## 2022-07-20 MED ORDER — ESCITALOPRAM OXALATE 20 MG PO TABS: 20.0000 mg | ORAL_TABLET | Freq: Every day | ORAL | 1 refills | Status: DC

## 2022-07-20 NOTE — Patient Instructions (Signed)
Continue carbamazepine 600 mg AM, 400 mg PM  Continue bupropion 150 mg daily  Continue lexapro 20 mg daily  Next appointment: 6/14 at 10 am

## 2022-07-26 ENCOUNTER — Encounter: Payer: Self-pay | Admitting: Internal Medicine

## 2022-07-26 ENCOUNTER — Other Ambulatory Visit: Payer: Self-pay | Admitting: Internal Medicine

## 2022-07-26 MED ORDER — PHENTERMINE HCL 37.5 MG PO CAPS
37.5000 mg | ORAL_CAPSULE | ORAL | 1 refills | Status: DC
Start: 2022-07-26 — End: 2023-03-21

## 2022-08-02 ENCOUNTER — Ambulatory Visit: Payer: BC Managed Care – PPO | Admitting: Internal Medicine

## 2022-08-07 ENCOUNTER — Ambulatory Visit: Payer: BC Managed Care – PPO | Admitting: Internal Medicine

## 2022-09-13 ENCOUNTER — Encounter: Payer: Self-pay | Admitting: Internal Medicine

## 2022-09-16 ENCOUNTER — Other Ambulatory Visit: Payer: Self-pay | Admitting: Internal Medicine

## 2022-09-16 DIAGNOSIS — N529 Male erectile dysfunction, unspecified: Secondary | ICD-10-CM

## 2022-09-16 MED ORDER — TADALAFIL 10 MG PO TABS
10.0000 mg | ORAL_TABLET | Freq: Every day | ORAL | 1 refills | Status: AC | PRN
Start: 2022-09-16 — End: ?

## 2022-09-18 ENCOUNTER — Encounter: Payer: BC Managed Care – PPO | Admitting: Internal Medicine
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
## 2022-09-28 ENCOUNTER — Telehealth: Payer: BC Managed Care – PPO | Admitting: Psychiatry
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
## 2022-10-09 ENCOUNTER — Telehealth (INDEPENDENT_AMBULATORY_CARE_PROVIDER_SITE_OTHER): Payer: BC Managed Care – PPO | Admitting: Psychiatry

## 2022-10-09 ENCOUNTER — Encounter: Payer: Self-pay | Admitting: Psychiatry

## 2022-10-09 DIAGNOSIS — F319 Bipolar disorder, unspecified: Secondary | ICD-10-CM

## 2022-10-09 DIAGNOSIS — F411 Generalized anxiety disorder: Secondary | ICD-10-CM

## 2022-10-09 DIAGNOSIS — F502 Bulimia nervosa, unspecified: Secondary | ICD-10-CM

## 2022-10-09 MED ORDER — CARBAMAZEPINE 200 MG PO TABS
ORAL_TABLET | ORAL | 1 refills | Status: DC
Start: 2022-11-19 — End: 2023-06-07

## 2022-10-19 ENCOUNTER — Encounter: Payer: Self-pay | Admitting: Internal Medicine

## 2022-10-22 ENCOUNTER — Other Ambulatory Visit: Payer: Self-pay | Admitting: Internal Medicine

## 2022-10-22 DIAGNOSIS — F502 Bulimia nervosa: Secondary | ICD-10-CM

## 2022-10-22 DIAGNOSIS — F319 Bipolar disorder, unspecified: Secondary | ICD-10-CM

## 2022-10-26 ENCOUNTER — Telehealth: Payer: Self-pay | Admitting: Psychiatry

## 2022-10-26 ENCOUNTER — Other Ambulatory Visit: Payer: Self-pay | Admitting: Internal Medicine

## 2022-10-26 DIAGNOSIS — E782 Mixed hyperlipidemia: Secondary | ICD-10-CM

## 2022-10-26 DIAGNOSIS — F319 Bipolar disorder, unspecified: Secondary | ICD-10-CM

## 2022-10-26 DIAGNOSIS — R7303 Prediabetes: Secondary | ICD-10-CM

## 2022-10-26 DIAGNOSIS — E559 Vitamin D deficiency, unspecified: Secondary | ICD-10-CM

## 2022-10-26 DIAGNOSIS — I10 Essential (primary) hypertension: Secondary | ICD-10-CM

## 2022-10-26 DIAGNOSIS — Z125 Encounter for screening for malignant neoplasm of prostate: Secondary | ICD-10-CM

## 2022-10-26 LAB — ABO AND RH: Rh Factor: NEGATIVE

## 2022-10-26 LAB — ANTIBODY SCREEN: Antibody Screen: NEGATIVE

## 2022-10-26 LAB — CARBAMAZEPINE LEVEL, TOTAL: Carbamazepine (Tegretol), S: 15.6 ug/mL (ref 4.0–12.0)

## 2022-10-26 NOTE — Telephone Encounter (Signed)
I was notified by Dr. Allena Katz regarding the carbamazepine level, which was higher than the normal range. Please advise him to lower the carbamazepine dose to 400 mg twice a day, and let me know if he needs a refill. Additionally, I would like him to recheck the lab in a week. I am planning to order it through [facility] unless he strongly prefers to have it done at his primary care. Please let me know how he wants to proceed.

## 2022-10-26 NOTE — Progress Notes (Signed)
Hi Dr. Allena Katz,  Thank you so much for notifying us. Our office will call him to reduce the dose at this time. Would you be able to recheck his levels in a week if possible? Although I would be happy to order it for him, he preferred to have it done at your practice the previous time. Additionally, I advised him to obtain a CBC and CMP, which have already been ordered at your practice. We will also advise him to get those done. Thank you so much.

## 2022-10-26 NOTE — Telephone Encounter (Signed)
Spoke to patient he voiced understanding about the dosage change and does not need a refill he just picked up a 90 day supply. He is ok with rechecking his labs but will not be able to do it until after November 10, 2022 due to leaving to go out of town with his job. He will have the labs done at his primary care on November 12, 2022
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
## 2022-12-21 ENCOUNTER — Other Ambulatory Visit: Payer: Self-pay

## 2022-12-21 ENCOUNTER — Telehealth: Payer: Self-pay | Admitting: *Deleted

## 2022-12-21 DIAGNOSIS — G4733 Obstructive sleep apnea (adult) (pediatric): Secondary | ICD-10-CM

## 2022-12-21 NOTE — Telephone Encounter (Signed)
Paperwork faxed to DME company. Called patient back to inform him that information was faxed. Nothing further needed at this time. Patient was informed to call when ready to schedule appointment once patient is back in town. Closing encounter.

## 2022-12-21 NOTE — Telephone Encounter (Signed)
Patient would like to see if our office could send a copy of his CPAP order.   Patient's chin strap broke and patient is working out of town in [facility].   The DME company working with him in [place] needs to see the order before they are willing to help him.   Patient is using [employer] in [facility] [place]. Phone number [phone] fax number [phone]

## 2022-12-25 ENCOUNTER — Telehealth: Payer: Self-pay | Admitting: Pulmonary Disease

## 2022-12-25 NOTE — Telephone Encounter (Signed)
Brain from [employer] is calling. Says he received order from dr.Alva and needs Patient Demographics.

## 2022-12-25 NOTE — Telephone Encounter (Signed)
I tried to call Calvin Cole with [employer] back at the number given and it states the number you have requested can not be dialed

## 2022-12-26 ENCOUNTER — Telehealth: Payer: BC Managed Care – PPO | Admitting: Psychiatry

## 2022-12-28 NOTE — Telephone Encounter (Signed)
Is this something we can send a community message on since it is [employer]?

## 2023-01-19 ENCOUNTER — Other Ambulatory Visit: Payer: Self-pay | Admitting: Internal Medicine

## 2023-01-19 ENCOUNTER — Other Ambulatory Visit: Payer: Self-pay | Admitting: Psychiatry

## 2023-01-19 DIAGNOSIS — E782 Mixed hyperlipidemia: Secondary | ICD-10-CM
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
## 2023-01-22 ENCOUNTER — Telehealth: Payer: Self-pay | Admitting: Pulmonary Disease

## 2023-01-22 NOTE — Telephone Encounter (Signed)
disregard

## 2023-01-24 ENCOUNTER — Encounter: Payer: Self-pay | Admitting: Psychiatry

## 2023-01-24 ENCOUNTER — Telehealth: Payer: BC Managed Care – PPO | Admitting: Psychiatry

## 2023-01-24 DIAGNOSIS — F411 Generalized anxiety disorder: Secondary | ICD-10-CM | POA: Diagnosis not present

## 2023-01-24 DIAGNOSIS — F5089 Other specified eating disorder: Secondary | ICD-10-CM

## 2023-01-24 DIAGNOSIS — F319 Bipolar disorder, unspecified: Secondary | ICD-10-CM | POA: Diagnosis not present

## 2023-01-24 MED ORDER — ESCITALOPRAM OXALATE 20 MG PO TABS: 20.0000 mg | ORAL_TABLET | Freq: Every day | ORAL | 1 refills | Status: DC

## 2023-01-24 NOTE — Patient Instructions (Signed)
Continue carbamazepine 400 mg AM, 400 mg PM  Continue bupropion 150 mg daily  Continue lexapro 20 mg daily  Next appointment: 1/3 at 11:30

## 2023-02-01 ENCOUNTER — Telehealth: Payer: Self-pay | Admitting: Psychiatry

## 2023-02-01 LAB — CBC WITH DIFFERENTIAL/PLATELET
Basophils Absolute: 0.1 10*3/uL (ref 0.0–0.2)
Basos: 1 %
EOS (ABSOLUTE): 0.1 10*3/uL (ref 0.0–0.4)
Eos: 2 %
Hematocrit: 45.8 % (ref 37.5–51.0)
Hemoglobin: 14.9 g/dL (ref 13.0–17.7)
Immature Grans (Abs): 0 10*3/uL (ref 0.0–0.1)
Immature Granulocytes: 0 %
Lymphocytes Absolute: 1.6 10*3/uL (ref 0.7–3.1)
Lymphs: 27 %
MCH: 33.6 pg — ABNORMAL HIGH (ref 26.6–33.0)
MCHC: 32.5 g/dL (ref 31.5–35.7)
MCV: 103 fL — ABNORMAL HIGH (ref 79–97)
Monocytes Absolute: 0.6 10*3/uL (ref 0.1–0.9)
Monocytes: 11 %
Neutrophils Absolute: 3.3 10*3/uL (ref 1.4–7.0)
Neutrophils: 59 %
Platelets: 207 10*3/uL (ref 150–450)
RBC: 4.44 x10E6/uL (ref 4.14–5.80)
RDW: 12.4 % (ref 11.6–15.4)
WBC: 5.7 10*3/uL (ref 3.4–10.8)

## 2023-02-01 LAB — CMP14+EGFR
ALT: 41 [IU]/L (ref 0–44)
AST: 23 [IU]/L (ref 0–40)
Albumin: 4.4 g/dL (ref 3.9–4.9)
Alkaline Phosphatase: 77 [IU]/L (ref 44–121)
BUN/Creatinine Ratio: 15 (ref 10–24)
BUN: 14 mg/dL (ref 8–27)
Bilirubin Total: 0.2 mg/dL (ref 0.0–1.2)
CO2: 23 mmol/L (ref 20–29)
Calcium: 9.3 mg/dL (ref 8.6–10.2)
Chloride: 103 mmol/L (ref 96–106)
Creatinine, Ser: 0.94 mg/dL (ref 0.76–1.27)
Globulin, Total: 2.2 g/dL (ref 1.5–4.5)
Glucose: 91 mg/dL (ref 70–99)
Potassium: 4.6 mmol/L (ref 3.5–5.2)
Sodium: 142 mmol/L (ref 134–144)
Total Protein: 6.6 g/dL (ref 6.0–8.5)
eGFR: 91 mL/min/{1.73_m2} (ref >59)

## 2023-02-01 LAB — PSA: Prostate Specific Ag, Serum: 1.4 ng/mL (ref 0.0–4.0)

## 2023-02-01 LAB — HEMOGLOBIN A1C
Est. average glucose Bld gHb Est-mCnc: 114 mg/dL
Hgb A1c MFr Bld: 5.6 % (ref 4.8–5.6)

## 2023-02-01 LAB — LIPID PANEL
Chol/HDL Ratio: 3.5 {ratio} (ref 0.0–5.0)
Cholesterol, Total: 223 mg/dL — ABNORMAL HIGH (ref 100–199)
HDL: 63 mg/dL (ref >39)
LDL Chol Calc (NIH): 124 mg/dL — ABNORMAL HIGH (ref 0–99)
Triglycerides: 204 mg/dL — ABNORMAL HIGH (ref 0–149)
VLDL Cholesterol Cal: 36 mg/dL (ref 5–40)

## 2023-02-01 LAB — VITAMIN D 25 HYDROXY (VIT D DEFICIENCY, FRACTURES): Vit D, 25-Hydroxy: 25.6 ng/mL — ABNORMAL LOW (ref 30.0–100.0)

## 2023-02-01 LAB — CARBAMAZEPINE LEVEL, TOTAL: Carbamazepine (Tegretol), S: 12.1 ug/mL — ABNORMAL HIGH (ref 4.0–12.0)

## 2023-02-01 LAB — TSH: TSH: 3.46 u[IU]/mL (ref 0.450–4.500)

## 2023-02-01 NOTE — Telephone Encounter (Signed)
Called to inform patient of the message  no answer left voicemail for patient to return call to office

## 2023-02-01 NOTE — Progress Notes (Signed)
Good morning. Thank you so much for letting me now. We will plan to contact him regarding this- likely reduce by 200 mg this time. Thanks.

## 2023-02-01 NOTE — Telephone Encounter (Signed)
Could you contact him regarding his carbamazepine level, which is slightly above the normal range? Ask him to reduce his dose by 200 mg and adjust to 400 mg in the morning and 200 mg in the evening. Please advise him to notify us if he notices any changes in his mood after this adjustment. We will plan to recheck his labs in a few months.

## 2023-02-04 ENCOUNTER — Telehealth: Payer: Self-pay

## 2023-02-04 NOTE — Telephone Encounter (Signed)
Spoke to patient made him aware of the following message from Dr Vanetta Shawl he voiced understanding    Could you contact him regarding his carbamazepine level, which is slightly above the normal range? Ask him to reduce his dose by 200 mg and adjust to 400 mg in the morning and 200 mg in the evening. Please advise him to notify us if he notices any changes in his mood after this adjustment. We will plan to recheck his labs in a few months.

## 2023-03-11 ENCOUNTER — Ambulatory Visit
Admission: EM | Admit: 2023-03-11 | Discharge: 2023-03-11 | Disposition: A | Discharge status: Home / Self Care | Payer: BC Managed Care – PPO | Attending: Family Medicine | Admitting: Family Medicine

## 2023-03-11 DIAGNOSIS — H6122 Impacted cerumen, left ear: Secondary | ICD-10-CM

## 2023-03-11 DIAGNOSIS — H66002 Acute suppurative otitis media without spontaneous rupture of ear drum, left ear: Secondary | ICD-10-CM

## 2023-03-11 MED ORDER — AMOXICILLIN 875 MG PO TABS: 875.0000 mg | ORAL_TABLET | Freq: Two times a day (BID) | ORAL | 0 refills | Status: DC

## 2023-03-11 NOTE — Discharge Instructions (Signed)
I have sent in a course of antibiotics for left ear infection.  You may use Debrox drops for wax softening and once the ear pain is resolved you may begin flushing with warm water and peroxide.  Use Flonase twice daily and decongestants such as Coricidin HBP for ear pain additionally.

## 2023-03-11 NOTE — ED Triage Notes (Signed)
Pt reports intermittent  left ear pain and constant pressure. Scratchy throat x 3 days
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
## 2023-03-20 LAB — CBC WITH DIFFERENTIAL/PLATELET
Basophils Absolute: 0.1 10*3/uL (ref 0.0–0.2)
Basos: 1 %
EOS (ABSOLUTE): 0.1 10*3/uL (ref 0.0–0.4)
Eos: 2 %
Hematocrit: 43.2 % (ref 37.5–51.0)
Hemoglobin: 14.6 g/dL (ref 13.0–17.7)
Immature Grans (Abs): 0 10*3/uL (ref 0.0–0.1)
Immature Granulocytes: 0 %
Lymphocytes Absolute: 1.3 10*3/uL (ref 0.7–3.1)
Lymphs: 25 %
MCH: 33.5 pg — ABNORMAL HIGH (ref 26.6–33.0)
MCHC: 33.8 g/dL (ref 31.5–35.7)
MCV: 99 fL — ABNORMAL HIGH (ref 79–97)
Monocytes Absolute: 0.4 10*3/uL (ref 0.1–0.9)
Monocytes: 8 %
Neutrophils Absolute: 3.4 10*3/uL (ref 1.4–7.0)
Neutrophils: 64 %
Platelets: 200 10*3/uL (ref 150–450)
RBC: 4.36 x10E6/uL (ref 4.14–5.80)
RDW: 12.2 % (ref 11.6–15.4)
WBC: 5.2 10*3/uL (ref 3.4–10.8)

## 2023-03-20 LAB — CMP14+EGFR
ALT: 36 [IU]/L (ref 0–44)
AST: 28 [IU]/L (ref 0–40)
Albumin: 4.7 g/dL (ref 3.9–4.9)
Alkaline Phosphatase: 73 [IU]/L (ref 44–121)
BUN/Creatinine Ratio: 17 (ref 10–24)
BUN: 16 mg/dL (ref 8–27)
Bilirubin Total: 0.3 mg/dL (ref 0.0–1.2)
CO2: 21 mmol/L (ref 20–29)
Calcium: 9.4 mg/dL (ref 8.6–10.2)
Chloride: 101 mmol/L (ref 96–106)
Creatinine, Ser: 0.95 mg/dL (ref 0.76–1.27)
Globulin, Total: 2.7 g/dL (ref 1.5–4.5)
Glucose: 103 mg/dL — ABNORMAL HIGH (ref 70–99)
Potassium: 4.5 mmol/L (ref 3.5–5.2)
Sodium: 139 mmol/L (ref 134–144)
Total Protein: 7.4 g/dL (ref 6.0–8.5)
eGFR: 89 mL/min/{1.73_m2} (ref >59)

## 2023-03-20 LAB — HEMOGLOBIN A1C
Est. average glucose Bld gHb Est-mCnc: 123 mg/dL
Hgb A1c MFr Bld: 5.9 % — ABNORMAL HIGH (ref 4.8–5.6)

## 2023-03-20 LAB — LIPID PANEL
Chol/HDL Ratio: 3.1 {ratio} (ref 0.0–5.0)
Cholesterol, Total: 203 mg/dL — ABNORMAL HIGH (ref 100–199)
HDL: 66 mg/dL (ref >39)
LDL Chol Calc (NIH): 120 mg/dL — ABNORMAL HIGH (ref 0–99)
Triglycerides: 98 mg/dL (ref 0–149)
VLDL Cholesterol Cal: 17 mg/dL (ref 5–40)

## 2023-03-20 LAB — TSH: TSH: 3.39 u[IU]/mL (ref 0.450–4.500)

## 2023-03-20 LAB — PSA: Prostate Specific Ag, Serum: 1.4 ng/mL (ref 0.0–4.0)

## 2023-03-20 LAB — VITAMIN D 25 HYDROXY (VIT D DEFICIENCY, FRACTURES): Vit D, 25-Hydroxy: 34.6 ng/mL (ref 30.0–100.0)

## 2023-03-21 ENCOUNTER — Ambulatory Visit (INDEPENDENT_AMBULATORY_CARE_PROVIDER_SITE_OTHER): Payer: Medicare Other | Admitting: Internal Medicine

## 2023-03-21 ENCOUNTER — Encounter: Payer: Self-pay | Admitting: Internal Medicine

## 2023-03-21 VITALS — BP 136/72 | HR 73 | Ht 74.0 in | Wt 279.4 lb

## 2023-03-21 DIAGNOSIS — G4733 Obstructive sleep apnea (adult) (pediatric): Secondary | ICD-10-CM

## 2023-03-21 DIAGNOSIS — F319 Bipolar disorder, unspecified: Secondary | ICD-10-CM | POA: Diagnosis not present

## 2023-03-21 DIAGNOSIS — I10 Essential (primary) hypertension: Secondary | ICD-10-CM | POA: Diagnosis not present

## 2023-03-21 DIAGNOSIS — Z0001 Encounter for general adult medical examination with abnormal findings: Secondary | ICD-10-CM | POA: Diagnosis not present

## 2023-03-21 DIAGNOSIS — E782 Mixed hyperlipidemia: Secondary | ICD-10-CM

## 2023-03-21 DIAGNOSIS — M542 Cervicalgia: Secondary | ICD-10-CM | POA: Insufficient documentation

## 2023-03-21 DIAGNOSIS — Z23 Encounter for immunization: Secondary | ICD-10-CM

## 2023-03-21 NOTE — Assessment & Plan Note (Signed)
Uses CPAP regularly, continues to benefit from it 

## 2023-03-21 NOTE — Assessment & Plan Note (Signed)
Lipid profile reviewed Continue Atorvastatin 40 mg qHS, advised for compliance 

## 2023-03-21 NOTE — Assessment & Plan Note (Signed)
Likely strained muscle Has had dry needling with chiropractor with some relief Offered PM&R referral for US guided injection, he prefers to wait for now
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
## 2023-03-21 NOTE — Assessment & Plan Note (Signed)
 Physical exam as documented. Counseling done  re healthy lifestyle involving commitment to 150 minutes exercise per week, heart healthy diet, and attaining healthy weight.The importance of adequate sleep also discussed. Immunization and cancer screening needs are specifically addressed at this visit.

## 2023-03-21 NOTE — Assessment & Plan Note (Signed)
On Carbamazepine, Lexapro and Wellbutrin Follows up with Psychiatrist

## 2023-03-21 NOTE — Patient Instructions (Addendum)
 Schedule your Medicare Annual Wellness Visit at checkout.  Please continue to take medications as prescribed.  Please continue to follow low carb diet and perform moderate exercise/walking at least 150 mins/week.

## 2023-03-21 NOTE — Assessment & Plan Note (Signed)
BP Readings from Last 1 Encounters:  03/21/23 (!) 143/84   Well-controlled with Amlodipine and Losartan Counseled for compliance with the medications Advised DASH diet and moderate exercise/walking, at least 150 mins/week

## 2023-03-21 NOTE — Assessment & Plan Note (Signed)
BMI Readings from Last 3 Encounters:  03/21/23 35.87 kg/m  07/19/22 36.68 kg/m  02/01/22 35.44 kg/m   Associated with HTN, OSA, GERD and HLD Diet modification and moderate exercise advised

## 2023-03-27 ENCOUNTER — Encounter: Payer: Self-pay | Admitting: Internal Medicine

## 2023-03-27 ENCOUNTER — Other Ambulatory Visit: Payer: Self-pay | Admitting: Psychiatry

## 2023-03-31 ENCOUNTER — Other Ambulatory Visit: Payer: Self-pay | Admitting: Internal Medicine

## 2023-03-31 DIAGNOSIS — M542 Cervicalgia: Secondary | ICD-10-CM

## 2023-04-01 ENCOUNTER — Other Ambulatory Visit: Payer: Self-pay | Admitting: Internal Medicine

## 2023-04-01 DIAGNOSIS — K219 Gastro-esophageal reflux disease without esophagitis: Secondary | ICD-10-CM

## 2023-04-02 ENCOUNTER — Telehealth: Payer: Self-pay | Admitting: Physical Medicine and Rehabilitation

## 2023-04-02 ENCOUNTER — Other Ambulatory Visit: Payer: Self-pay | Admitting: Physical Medicine and Rehabilitation

## 2023-04-02 NOTE — Telephone Encounter (Signed)
Pt called to set an appt from referral for Dr Alvester Morin. Please call pt at [phone].

## 2023-04-04 ENCOUNTER — Encounter: Payer: Self-pay | Admitting: Physical Medicine and Rehabilitation

## 2023-04-04 ENCOUNTER — Ambulatory Visit: Payer: Medicare Other | Admitting: Physical Medicine and Rehabilitation

## 2023-04-04 VITALS — BP 112/65

## 2023-04-04 DIAGNOSIS — M7918 Myalgia, other site: Secondary | ICD-10-CM

## 2023-04-04 DIAGNOSIS — M542 Cervicalgia: Secondary | ICD-10-CM
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
## 2023-04-04 NOTE — Progress Notes (Signed)
Cervical pain.  He stated it was a trigger paint.  No arm pain. Pain for 6 months now.  Tylenol for pain as needed with no help.  No injury he stated it just came out of nowhere.
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
## 2023-04-11 MED ORDER — TRIAMCINOLONE ACETONIDE 40 MG/ML IJ SUSP
40.0000 mg | INTRAMUSCULAR | Status: AC | PRN
  Administered 2023-04-04: 40 mg via INTRAMUSCULAR

## 2023-04-11 MED ORDER — LIDOCAINE HCL 1 % IJ SOLN
5.0000 mL | INTRAMUSCULAR | Status: AC | PRN
  Administered 2023-04-04: 5 mL

## 2023-04-13 ENCOUNTER — Other Ambulatory Visit: Payer: Self-pay | Admitting: Psychiatry

## 2023-04-13 ENCOUNTER — Other Ambulatory Visit: Payer: Self-pay | Admitting: Internal Medicine

## 2023-04-13 DIAGNOSIS — I10 Essential (primary) hypertension: Secondary | ICD-10-CM

## 2023-04-19 ENCOUNTER — Telehealth: Payer: Medicare Other | Admitting: Psychiatry

## 2023-04-29 ENCOUNTER — Other Ambulatory Visit: Payer: Self-pay | Admitting: Internal Medicine

## 2023-04-29 DIAGNOSIS — M9901 Segmental and somatic dysfunction of cervical region: Secondary | ICD-10-CM | POA: Diagnosis not present

## 2023-04-29 DIAGNOSIS — M9902 Segmental and somatic dysfunction of thoracic region: Secondary | ICD-10-CM | POA: Diagnosis not present

## 2023-04-29 DIAGNOSIS — M546 Pain in thoracic spine: Secondary | ICD-10-CM | POA: Diagnosis not present

## 2023-04-29 DIAGNOSIS — M542 Cervicalgia: Secondary | ICD-10-CM | POA: Diagnosis not present

## 2023-04-29 DIAGNOSIS — I10 Essential (primary) hypertension: Secondary | ICD-10-CM

## 2023-05-13 DIAGNOSIS — M9902 Segmental and somatic dysfunction of thoracic region: Secondary | ICD-10-CM | POA: Diagnosis not present

## 2023-05-13 DIAGNOSIS — M542 Cervicalgia: Secondary | ICD-10-CM | POA: Diagnosis not present

## 2023-05-13 DIAGNOSIS — M546 Pain in thoracic spine: Secondary | ICD-10-CM | POA: Diagnosis not present

## 2023-05-13 DIAGNOSIS — M9901 Segmental and somatic dysfunction of cervical region: Secondary | ICD-10-CM | POA: Diagnosis not present

## 2023-05-27 DIAGNOSIS — M9902 Segmental and somatic dysfunction of thoracic region: Secondary | ICD-10-CM | POA: Diagnosis not present

## 2023-05-27 DIAGNOSIS — M546 Pain in thoracic spine: Secondary | ICD-10-CM | POA: Diagnosis not present

## 2023-05-27 DIAGNOSIS — M542 Cervicalgia: Secondary | ICD-10-CM | POA: Diagnosis not present

## 2023-05-27 DIAGNOSIS — M9901 Segmental and somatic dysfunction of cervical region: Secondary | ICD-10-CM | POA: Diagnosis not present

## 2023-06-03 ENCOUNTER — Encounter: Payer: Self-pay | Admitting: Primary Care

## 2023-06-03 ENCOUNTER — Ambulatory Visit: Payer: Medicare Other | Admitting: Primary Care

## 2023-06-03 VITALS — BP 128/77 | HR 78 | Ht 74.0 in | Wt 276.0 lb

## 2023-06-03 DIAGNOSIS — F502 Bulimia nervosa, unspecified: Secondary | ICD-10-CM | POA: Diagnosis not present

## 2023-06-03 DIAGNOSIS — G4733 Obstructive sleep apnea (adult) (pediatric): Secondary | ICD-10-CM

## 2023-06-03 DIAGNOSIS — M5126 Other intervertebral disc displacement, lumbar region: Secondary | ICD-10-CM | POA: Insufficient documentation

## 2023-06-03 DIAGNOSIS — M5440 Lumbago with sciatica, unspecified side: Secondary | ICD-10-CM | POA: Insufficient documentation

## 2023-06-03 NOTE — Progress Notes (Signed)
 @Patient  ID: Calvin Cole, male    DOB: Jul 29, 1955, 68 y.o.   MRN: [id number]  Chief Complaint  Patient presents with   Sleep Apnea    Currently on CPAP; did not bring SD card    Referring provider: Anabel Halon, MD  HPI: 68 year old male. PMH significant for HTN, moderate OSA GERD, bipolar disorder, bulimia, HLD, lumbar stenosis, obesity. Former patient of Dr. Craige Cotta  06/03/2023 Patient presents today for regular OV/OSA on CPAP.   Discussed the use of AI scribe software for clinical note transcription with the patient, who gave verbal consent to proceed.  History of Present Illness   Calvin Cole is a 68 year old male with sleep apnea who presents for CPAP machine evaluation and potential replacement. His wife confirms that he is quiet during sleep.  He has a history of sleep apnea diagnosed in 2016 following a sleep study at [facility]. His apneas are well controlled with the CPAP, and he does not experience episodes of snoring or waking up gasping. He feels refreshed upon waking and has noticed significant benefits from using the CPAP.  He has been using a CPAP machine for approximately nine years, which is his first and only unit. The machine is still functioning well, and he uses it every night. Occasionally, he experiences issues with the nasal pillows dislodging if he rolls over, but he readjusts them and returns to sleep without further problems. He travels frequently for work and takes his CPAP machine with him, although he is interested in a smaller travel unit due to space constraints in his luggage. He uses a ResMed CPAP machine with a nasal pillow mask, specifically an NI30 model.  His weight has fluctuated since his initial sleep study, with an overall increase. He has not tried weight loss medications, although he has previously used phentermine with temporary success. He has stopped drinking alcohol, which he notes contributed to weight gain. He is currently working out at  [employer].  He has a history of bulimia and binge eating, which he manages with the help of his psychiatric doctor. He was previously prescribed bupropion, which initially helped but is no longer effective. He maintains regular contact with his healthcare providers.      Allergies  Allergen Reactions   Sulfa Antibiotics Other (See Comments)    fever    Immunization History  Administered Date(s) Administered   Influenza Split 12/10/2017   Influenza, Seasonal, Injecte, Preservative Fre 03/21/2023   Influenza,inj,Quad PF,6+ Mos 02/14/2017, 12/10/2017, 01/09/2022   Influenza-Unspecified 01/01/2013, 01/21/2014, 01/20/2015, 01/09/2019, 03/24/2021   Moderna Covid-19 Vaccine Bivalent Booster 35yrs & up 03/24/2021   Moderna Sars-Covid-2 Vaccination 04/27/2019, 05/25/2019   PPD Test 01/08/2020   Tdap 05/21/2013   Zoster Recombinant(Shingrix) 05/29/2021, 02/01/2022   Zoster, Live 12/26/2010    Past Medical History:  Diagnosis Date   Achilles tendinitis 05/23/2020   Anxiety    Arthritis    Bipolar 1 disorder (HCC)    Bulimia nervosa    GERD (gastroesophageal reflux disease)    High cholesterol    History of kidney stones    H/O   History of methicillin resistant staphylococcus aureus (MRSA) 2007   Hypertension    Melanoma (HCC)    [facility]/Dr. Clark/followed by Dr. Frankey Poot   Postoperative seroma 04/13/2014   Sleep apnea    Spinal stenosis of lumbar region     Tobacco History: Social History   Tobacco Use  Smoking Status Never  Smokeless Tobacco Never  Counseling given: Not Answered   Outpatient Medications Prior to Visit  Medication Sig Dispense Refill   amLODipine (NORVASC) 5 MG tablet TAKE 1 TABLET(5 MG) BY MOUTH DAILY 90 tablet 3   aspirin EC 81 MG tablet Take 81 mg by mouth daily.     atorvastatin (LIPITOR) 40 MG tablet TAKE 1 TABLET(40 MG) BY MOUTH DAILY 90 tablet 1   buPROPion (WELLBUTRIN XL) 150 MG 24 hr tablet Take 1 tablet (150 mg total) by mouth daily. 90 tablet 0    escitalopram (LEXAPRO) 20 MG tablet Take 1 tablet (20 mg total) by mouth daily. 90 tablet 1   fluticasone (FLONASE) 50 MCG/ACT nasal spray Place 2 sprays into both nostrils daily. 16 g 0   losartan (COZAAR) 100 MG tablet TAKE 1 TABLET(100 MG) BY MOUTH DAILY 90 tablet 3   multivitamin (PROSIGHT) TABS tablet Take 1 tablet by mouth daily. Patient should resume this as a home medication. No prescription provided at discharge. 30 each 0   pantoprazole (PROTONIX) 40 MG tablet TAKE 1 TABLET(40 MG) BY MOUTH DAILY 90 tablet 1   sodium chloride (OCEAN) 0.65 % SOLN nasal spray Place 1 spray into both nostrils as needed for congestion. 15 mL 0   tadalafil (CIALIS) 10 MG tablet Take 1 tablet (10 mg total) by mouth daily as needed for erectile dysfunction. 30 tablet 1   carbamazepine (TEGRETOL) 200 MG tablet Take 3 tablets (600 mg total) by mouth daily AND 2 tablets (400 mg total) every evening. 450 tablet 1   levocetirizine (XYZAL) 5 MG tablet Take 1 tablet (5 mg total) by mouth every evening. (Patient not taking: Reported on 06/03/2023) 30 tablet 0   No facility-administered medications prior to visit.   Review of Systems  Review of Systems  Constitutional: Negative.  Negative for unexpected weight change.  Respiratory: Negative.    Psychiatric/Behavioral:  Negative for sleep disturbance.      Physical Exam  BP 128/77   Pulse 78   Ht 6\' 2"  (1.88 m)   Wt 276 lb (125.2 kg)   SpO2 96%   BMI 35.44 kg/m  Physical Exam Constitutional:      Appearance: Normal appearance. He is obese.  HENT:     Head: Normocephalic and atraumatic.     Mouth/Throat:     Mouth: Mucous membranes are moist.     Pharynx: Oropharynx is clear.     Comments: Mallampati class II Cardiovascular:     Rate and Rhythm: Normal rate and regular rhythm.  Pulmonary:     Effort: Pulmonary effort is normal.     Breath sounds: Normal breath sounds. No wheezing, rhonchi or rales.  Musculoskeletal:        General: Normal range  of motion.  Skin:    General: Skin is warm and dry.  Neurological:     General: No focal deficit present.     Mental Status: He is alert and oriented to person, place, and time. Mental status is at baseline.  Psychiatric:        Mood and Affect: Mood normal.        Behavior: Behavior normal.        Thought Content: Thought content normal.        Judgment: Judgment normal.      Lab Results:  CBC    Component Value Date/Time   WBC 5.2 03/19/2023 1100   WBC 4.7 06/18/2018 1436   RBC 4.36 03/19/2023 1100   RBC 4.34 06/18/2018 1436  HGB 14.6 03/19/2023 1100   HCT 43.2 03/19/2023 1100   PLT 200 03/19/2023 1100   MCV 99 (H) 03/19/2023 1100   MCH 33.5 (H) 03/19/2023 1100   MCH 32.9 06/18/2018 1436   MCHC 33.8 03/19/2023 1100   MCHC 33.0 06/18/2018 1436   RDW 12.2 03/19/2023 1100   LYMPHSABS 1.3 03/19/2023 1100   MONOABS 0.4 06/18/2018 1436   EOSABS 0.1 03/19/2023 1100   BASOSABS 0.1 03/19/2023 1100    BMET    Component Value Date/Time   NA 139 03/19/2023 1100   K 4.5 03/19/2023 1100   CL 101 03/19/2023 1100   CO2 21 03/19/2023 1100   GLUCOSE 103 (H) 03/19/2023 1100   GLUCOSE 118 (H) 06/18/2018 1436   BUN 16 03/19/2023 1100   CREATININE 0.95 03/19/2023 1100   CREATININE 0.79 06/22/2017 1057   CALCIUM 9.4 03/19/2023 1100   GFRNONAA >60 06/18/2018 1436   GFRNONAA 88 01/17/2017 1146   GFRAA >60 06/18/2018 1436   GFRAA 102 01/17/2017 1146    BNP No results found for: "BNP"  ProBNP No results found for: "PROBNP"  Imaging: No results found.   Assessment & Plan:   1. OSA (obstructive sleep apnea) (Primary)  2. Bulimia nervosa, unspecified severity  3. Morbid obesity (HCC)     Obstructive Sleep Apnea Well controlled on CPAP therapy. Patient reports consistent use and symptom improvement. CPAP machine is 66 years old and has not been replaced. -Recommend replacing CPAP machine due to age and potential wear. -Obtain compliance report from current machine  (patient to bring in [facility] card). -Order new CPAP machine through [employer]. -Schedule follow-up within 30-90 days after receiving new machine.  Obesity Patient has struggled with weight and binge eating. Discussed potential benefit of weight loss medication Zepbound, given diagnosis of sleep apnea and obesity. -Recommend discussing Zepbound with primary care provider and insurance. -Continue current exercise regimen at local [employer].  Bulimia History of bulimia, currently managed by psychiatric provider. Patient reports no current bulimic behaviors. -Continue close follow-up with psychiatric provider. -Consider potential impact of new weight loss medication on eating behaviors.  Bupropion use Patient currently on bupropion, initially for appetite suppression, but reports it is no longer effective and would like to discontinue. -Recommend discussing discontinuation with psychiatric provider.             Glenford Bayley, NP 06/03/2023

## 2023-06-03 NOTE — Patient Instructions (Addendum)
 -  OBSTRUCTIVE SLEEP APNEA: Obstructive sleep apnea is a condition where your breathing stops and starts repeatedly during sleep. Your CPAP machine has been effective, but it is recommended to replace it due to its age. We will obtain a compliance report from your current machine and order a new one through [place]. Please schedule a follow-up within 30-90 days after receiving the new machine.  -OBESITY: Obesity is a condition characterized by excessive body fat. We discussed the potential benefit of the weight loss medication Zepbound. Please talk to your primary care provider and check with your insurance about this medication. Continue your exercise routine at the [facility].  -BULIMIA: Bulimia is an eating disorder characterized by binge eating followed by purging. You are managing this with your psychiatric provider and report no current bulimic behaviors. Continue your follow-ups with your psychiatric provider and consider how any new weight loss medication might affect your eating behaviors.  -BUPROPION USE: Bupropion is a medication that was initially helping with appetite suppression but is no longer effective for you. We recommend discussing discontinuing this medication with your psychiatric provider.  INSTRUCTIONS: Please bring by SD card from CPAP for compliance download   Please schedule a follow-up appointment within 30-90 days after receiving your new CPAP machine. Additionally, discuss the potential use of Zepbound and the discontinuation of bupropion with your primary care and psychiatric providers, respectively.

## 2023-06-04 ENCOUNTER — Encounter: Payer: Self-pay | Admitting: Internal Medicine

## 2023-06-04 DIAGNOSIS — F319 Bipolar disorder, unspecified: Secondary | ICD-10-CM

## 2023-06-04 MED ORDER — ESCITALOPRAM OXALATE 20 MG PO TABS: 20.0000 mg | ORAL_TABLET | Freq: Every day | ORAL | 1 refills | Status: DC

## 2023-06-04 NOTE — Telephone Encounter (Signed)
 Called patients pharmacy spoke to [facility] he stated that the patient does not have any refills on file.

## 2023-06-04 NOTE — Telephone Encounter (Signed)
 Could you please contact the pharmacy to verify? He should have a refill available. Please provide an update on the patient once confirmed. Thanks!

## 2023-06-07 MED ORDER — CARBAMAZEPINE 200 MG PO TABS: 400.0000 mg | ORAL_TABLET | Freq: Two times a day (BID) | ORAL | 0 refills | Status: DC

## 2023-06-07 NOTE — Addendum Note (Signed)
 Addended by: Neysa Hotter on: 06/07/2023 12:06 PM   Modules accepted: Orders

## 2023-06-20 NOTE — Telephone Encounter (Signed)
 Patient added to wait list , no  sooner appointments available at this time

## 2023-06-21 ENCOUNTER — Ambulatory Visit
Admission: RE | Admit: 2023-06-21 | Discharge: 2023-06-21 | Disposition: A | Discharge status: Home / Self Care | Source: Ambulatory Visit | Attending: Family Medicine | Admitting: Family Medicine

## 2023-06-21 VITALS — BP 149/74 | HR 72 | Temp 98.0°F | Resp 16

## 2023-06-21 DIAGNOSIS — J208 Acute bronchitis due to other specified organisms: Secondary | ICD-10-CM

## 2023-06-21 MED ORDER — ALBUTEROL SULFATE HFA 108 (90 BASE) MCG/ACT IN AERS: 2.0000 | INHALATION_SPRAY | RESPIRATORY_TRACT | 0 refills | Status: DC | PRN

## 2023-06-21 MED ORDER — DEXAMETHASONE SODIUM PHOSPHATE 10 MG/ML IJ SOLN
10.0000 mg | Freq: Once | INTRAMUSCULAR | Status: AC
  Administered 2023-06-21: 10 mg via INTRAMUSCULAR

## 2023-06-21 MED ORDER — ALBUTEROL SULFATE (2.5 MG/3ML) 0.083% IN NEBU
2.5000 mg | INHALATION_SOLUTION | Freq: Once | RESPIRATORY_TRACT | Status: AC
  Administered 2023-06-21: 2.5 mg via RESPIRATORY_TRACT

## 2023-06-21 NOTE — Telephone Encounter (Signed)
 Called and spoke w/ pt  he is going to stop on Monday to bring his card to the office to see if this can be read.

## 2023-06-21 NOTE — ED Triage Notes (Signed)
 Pt reports he has chest discomfort and heaviness, pain around his rib cage, and coughing up mucus x 9 days
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
## 2023-06-27 ENCOUNTER — Ambulatory Visit
Admission: RE | Admit: 2023-06-27 | Discharge: 2023-06-27 | Disposition: A | Discharge status: Home / Self Care | Source: Ambulatory Visit | Attending: Nurse Practitioner | Admitting: Nurse Practitioner

## 2023-06-27 VITALS — BP 121/68 | HR 79 | Temp 97.9°F | Resp 20

## 2023-06-27 DIAGNOSIS — J22 Unspecified acute lower respiratory infection: Secondary | ICD-10-CM | POA: Diagnosis not present

## 2023-06-27 MED ORDER — GUAIFENESIN 100 MG/5ML PO LIQD: 10.0000 mL | Freq: Four times a day (QID) | ORAL | 0 refills | Status: DC | PRN

## 2023-06-27 MED ORDER — PREDNISONE 20 MG PO TABS
40.0000 mg | ORAL_TABLET | Freq: Every day | ORAL | 0 refills | Status: AC
Start: 2023-06-27 — End: 2023-07-02

## 2023-06-27 MED ORDER — AZITHROMYCIN 250 MG PO TABS: 250.0000 mg | ORAL_TABLET | Freq: Every day | ORAL | 0 refills | Status: DC

## 2023-06-27 NOTE — ED Triage Notes (Signed)
 Pt reports he still has chest discomfort even after the shot given here x 6 days ago. states he cannot produce mucus that he has in his chest and is sick on his stomach.

## 2023-06-27 NOTE — Discharge Instructions (Addendum)
 Take medication as prescribed.  If you are unable to pick up the cough medicine through your insurance, you can also try over-the-counter Delsym or Robitussin DM for your cough. Continue to drink plenty of fluids and get plenty of rest. May take over-the-counter Tylenol or ibuprofen as needed for pain, fever, or general discomfort. Recommend using a humidifier in your bedroom at nighttime during sleep and sleeping elevated on pillows while cough symptoms persist. As discussed, if symptoms fail to improve, or appear to be worsening, you may follow-up in this clinic or with your primary care physician for further evaluation. Follow-up as needed.
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
## 2023-06-28 ENCOUNTER — Telehealth: Payer: Self-pay

## 2023-06-28 DIAGNOSIS — G4733 Obstructive sleep apnea (adult) (pediatric): Secondary | ICD-10-CM

## 2023-06-28 NOTE — Telephone Encounter (Signed)
 Airview download 03/26/2023 - 06/23/2023 Usage days 90/90 days (100%) Average usage 8 hours 38 minutes Pressure 6 to 20 cm H2O (12.9 cm H2O-95%) Air leaks 9.2 L/min (95%) AHI 1.6  Once we get sleep study results we can send in an order for replacement CPAP machine with pressure settings 6-20

## 2023-06-28 NOTE — Telephone Encounter (Signed)
 Called and spoke with patient he stated that he understood the process . He said that we are not able to get the results. He is willing to do a new sleep study .

## 2023-06-28 NOTE — Telephone Encounter (Signed)
 Pt came by the [place] office to have to his sd card read for his c-pap. Pt's compliance report was downloaded and was sent to you though  your from [place] email . Pt stated the last time he was seen his card couldn't be downloaded. Pt is request a new c-pap machine . He stated that this was discussed at his lov , he stated that you need his report in order for to send an order.  Pt is request an order to be sent to [facility]. Please Advise.

## 2023-06-28 NOTE — Telephone Encounter (Signed)
 [facility] is requesting his sleep study. Patient filled out a medical release for [facility] to send over his sleep study to [phone].

## 2023-06-28 NOTE — Telephone Encounter (Signed)
 Called and spoke with patient, advised of recommendations per [facility].  He verbalized understanding.  Order placed for HST with [facility].  Nothing further needed.

## 2023-06-28 NOTE — Telephone Encounter (Signed)
 Ok, please place order for HST with [facility] re: OSA

## 2023-07-01 ENCOUNTER — Encounter: Payer: Self-pay | Admitting: Neurology

## 2023-07-04 ENCOUNTER — Encounter: Payer: Self-pay | Admitting: Internal Medicine

## 2023-07-05 ENCOUNTER — Encounter: Payer: Self-pay | Admitting: Internal Medicine

## 2023-07-05 ENCOUNTER — Telehealth (INDEPENDENT_AMBULATORY_CARE_PROVIDER_SITE_OTHER): Admitting: Internal Medicine

## 2023-07-05 ENCOUNTER — Other Ambulatory Visit (HOSPITAL_COMMUNITY): Payer: Self-pay

## 2023-07-05 VITALS — Ht 74.0 in | Wt 277.0 lb

## 2023-07-05 DIAGNOSIS — J209 Acute bronchitis, unspecified: Secondary | ICD-10-CM

## 2023-07-05 DIAGNOSIS — G4733 Obstructive sleep apnea (adult) (pediatric): Secondary | ICD-10-CM | POA: Diagnosis not present

## 2023-07-05 MED ORDER — TIRZEPATIDE-WEIGHT MANAGEMENT 2.5 MG/0.5ML ~~LOC~~ SOLN: 2.5000 mg | SUBCUTANEOUS | 0 refills | Status: DC

## 2023-07-05 NOTE — Assessment & Plan Note (Signed)
 Recent urgent care visit note reviewed Completed oral azithromycin and prednisone Continue albuterol as needed for dyspnea or wheezing Mucinex as needed for cough

## 2023-07-05 NOTE — Patient Instructions (Signed)
 We have sent prescription of Zepbound. Please notify us when you receive the sleep study result.  Please use Mucinex as needed for cough. Please use Albuterol inhaler as needed for shortness of breath or wheezing.  Please continue to take other medications as prescribed.  Please continue to follow low carb diet and perform moderate exercise/walking at least 150 mins/week.
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
## 2023-07-05 NOTE — Assessment & Plan Note (Addendum)
 Uses CPAP regularly, continues to benefit from it Continue to follow low-carb diet  Recent studies ([facility]) showed significant improvement in OSA related apnea- hypopnea index with use of Zepbound Patient is an excellent candidate for Zepbound, started 2.5 mg qw, plan to increase dose as tolerated

## 2023-07-08 ENCOUNTER — Telehealth: Payer: Self-pay | Admitting: Pharmacy Technician

## 2023-07-08 NOTE — Telephone Encounter (Signed)
 Pharmacy Patient Advocate Encounter   Received notification from CoverMyMeds that prior authorization for Zepbound 2.5MG /0.5ML pen-injectors is required/requested.   Insurance verification completed.   The patient is insured through [facility] .   Per test claim: PA required; PA submitted to above mentioned insurance via CoverMyMeds Key/confirmation #/EOC [id number] Status is pending

## 2023-07-09 ENCOUNTER — Other Ambulatory Visit (HOSPITAL_COMMUNITY): Payer: Self-pay

## 2023-07-09 NOTE — Telephone Encounter (Signed)
 Pharmacy Patient Advocate Encounter  Received notification from [facility] that Prior Authorization for Zepbound 2.5MG /0.5ML pen-injectors has been APPROVED from 07/08/2023 to 07/07/2024. Ran test claim, Copay is [id number]. This test claim was processed through [facility]- copay amounts may vary at other pharmacies due to pharmacy/plan contracts, or as the patient moves through the different stages of their insurance plan.   PA #/Case ID/Reference #: Key: [id number]   Copay applied to deductible. Patient can contact plan for an explanation of his pharmacy coverage if he has any questions regarding copayment or deductible amounts. Patient is also likely to benefit from the [facility] Prescription Payment Plan.

## 2023-07-09 NOTE — Telephone Encounter (Unsigned)
 Copied from CRM [id number]. Topic: Clinical - Prescription Issue >> Jul 08, 2023  5:14 PM Shelah Lewandowsky wrote: Reason for CRM: [employer] [phone] opt 5- zepbound was approved starting today 07/08/23 through 07/07/2024

## 2023-07-10 ENCOUNTER — Encounter

## 2023-07-10 DIAGNOSIS — G4730 Sleep apnea, unspecified: Secondary | ICD-10-CM | POA: Diagnosis not present

## 2023-07-10 DIAGNOSIS — G4733 Obstructive sleep apnea (adult) (pediatric): Secondary | ICD-10-CM

## 2023-07-17 ENCOUNTER — Other Ambulatory Visit: Payer: Self-pay | Admitting: Psychiatry

## 2023-07-17 ENCOUNTER — Encounter: Payer: Self-pay | Admitting: Internal Medicine

## 2023-07-17 ENCOUNTER — Telehealth: Payer: Self-pay | Admitting: Psychiatry

## 2023-07-17 MED ORDER — BUPROPION HCL ER (XL) 150 MG PO TB24
150.0000 mg | ORAL_TABLET | Freq: Every day | ORAL | 0 refills | Status: DC
Start: 2023-07-17 — End: 2023-11-19

## 2023-07-17 NOTE — Telephone Encounter (Signed)
 Bupropion has been ordered. I believe he has enough of his other medications to last until his next visit. Please let me know if he needs anything further.

## 2023-07-17 NOTE — Telephone Encounter (Signed)
 Pt notified.  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  I advised him to make sure that he was going to have enough medications to last until he gets back because we can not send scripts out of state.  He states he will look into that before he leaves

## 2023-07-17 NOTE — Telephone Encounter (Signed)
 Could you contact the pharmacy. Refill was ordered at his last visit.

## 2023-07-17 NOTE — Telephone Encounter (Signed)
 I have not yet received results from sleep study When did he complete it, can take 2-3 weeks for results to get back  [facility]- can we look into that

## 2023-07-18 ENCOUNTER — Ambulatory Visit: Payer: Self-pay | Admitting: Family Medicine

## 2023-07-18 ENCOUNTER — Emergency Department (HOSPITAL_COMMUNITY)
Admission: EM | Admit: 2023-07-18 | Discharge: 2023-07-18 | Disposition: A | Discharge status: Home / Self Care | Attending: Emergency Medicine | Admitting: Emergency Medicine

## 2023-07-18 ENCOUNTER — Ambulatory Visit

## 2023-07-18 ENCOUNTER — Other Ambulatory Visit: Payer: Self-pay

## 2023-07-18 ENCOUNTER — Emergency Department (HOSPITAL_COMMUNITY)

## 2023-07-18 ENCOUNTER — Encounter (HOSPITAL_COMMUNITY): Payer: Self-pay

## 2023-07-18 ENCOUNTER — Other Ambulatory Visit: Payer: Self-pay | Admitting: Internal Medicine

## 2023-07-18 DIAGNOSIS — I10 Essential (primary) hypertension: Secondary | ICD-10-CM | POA: Diagnosis not present

## 2023-07-18 DIAGNOSIS — Z79899 Other long term (current) drug therapy: Secondary | ICD-10-CM | POA: Insufficient documentation

## 2023-07-18 DIAGNOSIS — R058 Other specified cough: Secondary | ICD-10-CM | POA: Diagnosis not present

## 2023-07-18 DIAGNOSIS — J210 Acute bronchiolitis due to respiratory syncytial virus: Secondary | ICD-10-CM | POA: Insufficient documentation

## 2023-07-18 DIAGNOSIS — Z7982 Long term (current) use of aspirin: Secondary | ICD-10-CM | POA: Insufficient documentation

## 2023-07-18 DIAGNOSIS — R0602 Shortness of breath: Secondary | ICD-10-CM | POA: Insufficient documentation

## 2023-07-18 DIAGNOSIS — J019 Acute sinusitis, unspecified: Secondary | ICD-10-CM | POA: Insufficient documentation

## 2023-07-18 DIAGNOSIS — R059 Cough, unspecified: Secondary | ICD-10-CM | POA: Diagnosis not present

## 2023-07-18 LAB — COMPREHENSIVE METABOLIC PANEL WITH GFR
ALT: 31 U/L (ref 0–44)
AST: 21 U/L (ref 15–41)
Albumin: 3.8 g/dL (ref 3.5–5.0)
Alkaline Phosphatase: 51 U/L (ref 38–126)
Anion gap: 11 (ref 5–15)
BUN: 14 mg/dL (ref 8–23)
CO2: 21 mmol/L — ABNORMAL LOW (ref 22–32)
Calcium: 9.1 mg/dL (ref 8.9–10.3)
Chloride: 103 mmol/L (ref 98–111)
Creatinine, Ser: 0.87 mg/dL (ref 0.61–1.24)
GFR, Estimated: >60 mL/min (ref >60)
Glucose, Bld: 88 mg/dL (ref 70–99)
Potassium: 3.6 mmol/L (ref 3.5–5.1)
Sodium: 135 mmol/L (ref 135–145)
Total Bilirubin: 0.4 mg/dL (ref 0.0–1.2)
Total Protein: 6.8 g/dL (ref 6.5–8.1)

## 2023-07-18 LAB — CBC
HCT: 41.4 % (ref 39.0–52.0)
Hemoglobin: 14 g/dL (ref 13.0–17.0)
MCH: 33.8 pg (ref 26.0–34.0)
MCHC: 33.8 g/dL (ref 30.0–36.0)
MCV: 100 fL (ref 80.0–100.0)
Platelets: 178 10*3/uL (ref 150–400)
RBC: 4.14 MIL/uL — ABNORMAL LOW (ref 4.22–5.81)
RDW: 12.6 % (ref 11.5–15.5)
WBC: 5.5 10*3/uL (ref 4.0–10.5)
nRBC: 0 % (ref 0.0–0.2)

## 2023-07-18 LAB — TROPONIN I (HIGH SENSITIVITY): Troponin I (High Sensitivity): 3 ng/L (ref <18)

## 2023-07-18 LAB — RESP PANEL BY RT-PCR (RSV, FLU A&B, COVID)  RVPGX2
Influenza A by PCR: NEGATIVE
Influenza B by PCR: NEGATIVE
Resp Syncytial Virus by PCR: POSITIVE — AB
SARS Coronavirus 2 by RT PCR: NEGATIVE

## 2023-07-18 LAB — BRAIN NATRIURETIC PEPTIDE: B Natriuretic Peptide: 24 pg/mL (ref 0.0–100.0)

## 2023-07-18 MED ORDER — BENZONATATE 100 MG PO CAPS: 100.0000 mg | ORAL_CAPSULE | Freq: Three times a day (TID) | ORAL | 0 refills | Status: DC

## 2023-07-18 MED ORDER — KETOROLAC TROMETHAMINE 15 MG/ML IJ SOLN
15.0000 mg | Freq: Once | INTRAMUSCULAR | Status: AC
Start: 2023-07-18 — End: 2023-07-18
  Administered 2023-07-18: 15 mg via INTRAVENOUS
  Filled 2023-07-18: qty 1

## 2023-07-18 MED ORDER — AMOXICILLIN-POT CLAVULANATE 875-125 MG PO TABS: 1.0000 | ORAL_TABLET | Freq: Two times a day (BID) | ORAL | 0 refills | Status: DC

## 2023-07-18 MED ORDER — AMOXICILLIN-POT CLAVULANATE 875-125 MG PO TABS
1.0000 | ORAL_TABLET | Freq: Once | ORAL | Status: AC
  Administered 2023-07-18: 1 via ORAL
  Filled 2023-07-18: qty 1

## 2023-07-18 NOTE — Telephone Encounter (Signed)
 Copied from CRM [id number]. Topic: Clinical - Red Word Triage >> Jul 18, 2023  8:04 AM Calvin Cole wrote: Red Word that prompted transfer to Nurse Triage: SOB/pain in L lung   Chief Complaint: Chest Soreness Symptoms: Soreness, Productive cough with green phlegm Frequency: since Feb 13th Pertinent Negatives: Patient denies fever, sweats, Disposition: [x] ED /[] Urgent Care (no appt availability in office) / [] Appointment(In office/virtual)/ []   Virtual Care/ [] Home Care/ [] Refused Recommended Disposition /[]  Mobile Bus/ []  Follow-up with PCP Additional Notes: Patient called and advised that he has been sick since Feb 13th, seen multiple providers, has an appt with Urgent Care this morning at 9 am but patient wanted to call his Pulmonology Office to see about getting an appointment. Patient denies having a fever or any history of pulmonary embolisms that he is aware of, denies sweats, and he states that the chest soreness around his left lower lobe of his lungs hurts 9 out of 10 when taking a deep breath or when coughing. Bronchitis suspected at Urgent Care and patient had taken antibiotics and steroid medications and states that he is still feeling bad and getting worse. Patient has tried over the counter medications with no relief, has a handheld inhaler Ventolin he has tried with no relief, and has had multiple trips to Urgent Care since his symptoms first started.  He states his Oxygen has been 93-94% on room air and he is usually in the 97-98% range.  He has also started having a productive cough with green phlegm noted. Patient states he has been dealing with this  Patient has not had an Xray of his chest at all yet. Patient able to speak in full sentences but this RN has noted him having to catch his breath every so often. Patient's cough has a harsh deep rhonchi sound over the phone during triage and some audible wheezing on exhalation over the phone during triage with this  RN. Patient is advised that at this time, going to the Emergency Room is recommended for further evaluation/treatment.  He is advised that there is nothing available that I can see at the Pulmonology office anyway for the time being and he is understanding and agreeable with ER disposition at this time. Patient states that he is going to go to [facility].  He is advised that if anything changes 911 is always available.  Patient verbalized understanding.    Reason for Disposition  Taking a deep breath makes pain worse  Answer Assessment - Initial Assessment Questions E2C2 Pulmonary Triage - Initial Assessment Questions "Chief Complaint (e.g., cough, sob, wheezing, fever, chills, sweat or additional symptoms) *Go to specific symptom protocol after initial questions. Chest soreness from coughing, wheezing, headaches  "How long have symptoms been present?" Feb 13th  Have you tested for COVID or Flu? Note: If not, ask patient if a home test can be taken. If so, instruct patient to call back for positive results. No  MEDICINES:   "Have you used any OTC meds to help with symptoms?" Yes If yes, ask "What medications?" Mucinex DM Tylenol Ibuprofen Robitussin  "Have you used your inhalers/maintenance medication?" Yes If yes, "What medications?" Ventolin inhaler--might need a refill  If inhaler, ask "How many puffs and how often?" Note: Review instructions on medication in the chart. 2 puffs every 4 hours PRN  OXYGEN: "Do you wear supplemental oxygen?" No If yes, "How many liters are you supposed to use?" No--but CPAP at night  "Do you monitor your oxygen  levels?" Yes If yes, "What is your reading (oxygen level) today?" 90s  93-94%  "What is your usual oxygen saturation reading?"  (Note: Pulmonary O2 sats should be 90% or greater) 97-98%   1. LOCATION: "Where does it hurt?"       Left lower area of rib cage 2. RADIATION: "Does the pain go anywhere else?" (e.g., into  neck, jaw, arms, back)     Just that area 3. ONSET: "When did the chest pain begin?" (Minutes, hours or days)      Feb 13th 4. PATTERN: "Does the pain come and go, or has it been constant since it started?"  "Does it get worse with exertion?"      Comes and goes but hurts worse with a deep breath 5. DURATION: "How long does it last" (e.g., seconds, minutes, hours)     On exhalation 6. SEVERITY: "How bad is the pain?"  (e.g., Scale 1-10; mild, moderate, or severe)    - MILD (1-3): doesn't interfere with normal activities     - MODERATE (4-7): interferes with normal activities or awakens from sleep    - SEVERE (8-10): excruciating pain, unable to do any normal activities       9 7. CARDIAC RISK FACTORS: "Do you have any history of heart problems or risk factors for heart disease?" (e.g., angina, prior heart attack; diabetes, high blood pressure, high cholesterol, smoker, or strong family history of heart disease)     No 8. PULMONARY RISK FACTORS: "Do you have any history of lung disease?"  (e.g., blood clots in lung, asthma, emphysema, birth control pills)     Sleep Apnea 9. CAUSE: "What do you think is causing the chest pain?"     Cough/Cold 10. OTHER SYMPTOMS: "Do you have any other symptoms?" (e.g., dizziness, nausea, vomiting, sweating, fever, difficulty breathing, cough)       Productive cough with green phlegm,  Protocols used: Chest Pain-A-AH

## 2023-07-18 NOTE — Discharge Instructions (Signed)
 Seen today for a productive cough and congestion has been ongoing for the past several weeks.  You have RSV infection which is likely why your cough is still persistent.  May take several more weeks for this to fully go away.  The green sputum production along with your sinus congestion are likely due to a sinus infection, so we will treat with antibiotic for this symptoms have been ongoing for several weeks.  Back to the ER if you have new or worsening symptoms.  You can take over-the-counter medication as needed for any discomfort.

## 2023-07-18 NOTE — ED Provider Notes (Signed)
 [place] EMERGENCY DEPARTMENT AT [facility] Provider Note   CSN: [id number] Arrival date & time: 07/18/23  4098     History  Chief Complaint  Patient presents with   Shortness of Breath    Calvin Cole is a 68 y.o. male.  He has PMH of high blood pressure, high cholesterol, obesity.  Presents the ER complaining of cough productive of green sputum that has been ongoing for approximately 1 month.  Initially he was treated with an inhaler and steroids for likely bronchitis.  Presented back to urgent care on 3/13 and was treated with a Z-Pak and symptoms have not resolved.  He states his pain left anterior chest just below the nipple when coughing, pressing on the area or taking a deep breath.  Denies dizziness, exertional chest pain, sweating or dizziness or other complaints.  No history of VTE or hemoptysis, no lower extremity swelling or pain. He has no recent surgery trauma or immobilization.  No history of cancer.   Shortness of Breath      Home Medications Prior to Admission medications   Medication Sig Start Date End Date Taking? Authorizing Provider  amoxicillin-clavulanate (AUGMENTIN) 875-125 MG tablet Take 1 tablet by mouth every 12 (twelve) hours. 07/18/23  Yes Mouhamadou Gittleman A, PA-C  benzonatate (TESSALON) 100 MG capsule Take 1 capsule (100 mg total) by mouth every 8 (eight) hours. 07/18/23  Yes Criss Pallone A, PA-C  albuterol (VENTOLIN HFA) 108 (90 Base) MCG/ACT inhaler Inhale 2 puffs into the lungs every 4 (four) hours as needed. 06/21/23   Particia Nearing, PA-C  amLODipine (NORVASC) 5 MG tablet TAKE 1 TABLET(5 MG) BY MOUTH DAILY 04/15/23   Anabel Halon, MD  aspirin EC 81 MG tablet Take 81 mg by mouth daily.    [provider]  atorvastatin (LIPITOR) 40 MG tablet TAKE 1 TABLET(40 MG) BY MOUTH DAILY 01/21/23   Anabel Halon, MD  buPROPion (WELLBUTRIN XL) 150 MG 24 hr tablet Take 1 tablet (150 mg total) by mouth daily. 07/17/23 10/15/23  Neysa Hotter, MD  carbamazepine (TEGRETOL) 200 MG tablet Take 2 tablets (400 mg total) by mouth in the morning and at bedtime. 06/07/23 09/05/23  Neysa Hotter, MD  escitalopram (LEXAPRO) 20 MG tablet Take 1 tablet (20 mg total) by mouth daily. 06/04/23 12/01/23  Neysa Hotter, MD  fluticasone (FLONASE) 50 MCG/ACT nasal spray Place 2 sprays into both nostrils daily. 02/28/22   Waldon Merl, PA-C  guaiFENesin (ROBITUSSIN) 100 MG/5ML liquid Take 10 mLs by mouth every 6 (six) hours as needed for cough or to loosen phlegm. 06/27/23   Leath-Warren, Sadie Haber, NP  levocetirizine (XYZAL) 5 MG tablet Take 1 tablet (5 mg total) by mouth every evening. 02/28/22   Waldon Merl, PA-C  losartan (COZAAR) 100 MG tablet TAKE 1 TABLET(100 MG) BY MOUTH DAILY 04/29/23   Anabel Halon, MD  multivitamin (PROSIGHT) TABS tablet Take 1 tablet by mouth daily. Patient should resume this as a home medication. No prescription provided at discharge. 11/22/16   Denzil Magnuson, NP  pantoprazole (PROTONIX) 40 MG tablet TAKE 1 TABLET(40 MG) BY MOUTH DAILY 04/01/23   Anabel Halon, MD  sodium chloride (OCEAN) 0.65 % SOLN nasal spray Place 1 spray into both nostrils as needed for congestion. 07/14/20   Anabel Halon, MD  tadalafil (CIALIS) 10 MG tablet Take 1 tablet (10 mg total) by mouth daily as needed for erectile dysfunction. 09/16/22   Allena Katz, Earlie Lou,  MD  tirzepatide (ZEPBOUND) 2.5 MG/0.5ML injection vial Inject 2.5 mg into the skin once a week. 07/05/23   Anabel Halon, MD      Allergies    Sulfa antibiotics    Review of Systems   Review of Systems  Respiratory:  Positive for shortness of breath.     Physical Exam Updated Vital Signs BP 111/62   Pulse 68   Temp 97.8 F (36.6 C) (Oral)   Resp 13   Ht 6\' 2"  (1.88 m)   Wt 125.6 kg   SpO2 98%   BMI 35.56 kg/m  Physical Exam Vitals and nursing note reviewed.  Constitutional:      General: He is not in acute distress.    Appearance: He is well-developed.   HENT:     Head: Normocephalic and atraumatic.     Nose:     Right Turbinates: Swollen.     Left Turbinates: Swollen.     Right Sinus: Maxillary sinus tenderness present.     Left Sinus: Maxillary sinus tenderness present.  Eyes:     Conjunctiva/sclera: Conjunctivae normal.  Cardiovascular:     Rate and Rhythm: Normal rate and regular rhythm.     Heart sounds: No murmur heard. Pulmonary:     Effort: Pulmonary effort is normal. No respiratory distress.     Breath sounds: Examination of the left-middle field reveals rhonchi. Examination of the right-lower field reveals wheezing. Examination of the left-lower field reveals wheezing and rhonchi. Wheezing and rhonchi present.  Abdominal:     Palpations: Abdomen is soft.     Tenderness: There is no abdominal tenderness.  Musculoskeletal:        General: No swelling.     Cervical back: Neck supple.     Right lower leg: No tenderness. No edema.     Left lower leg: No tenderness. No edema.  Skin:    General: Skin is warm and dry.     Capillary Refill: Capillary refill takes less than 2 seconds.  Neurological:     General: No focal deficit present.     Mental Status: He is alert and oriented to person, place, and time.  Psychiatric:        Mood and Affect: Mood normal.        Behavior: Behavior normal.     ED Results / Procedures / Treatments   Labs (all labs ordered are listed, but only abnormal results are displayed) Labs Reviewed  RESP PANEL BY RT-PCR (RSV, FLU A&B, COVID)  RVPGX2 - Abnormal; Notable for the following components:      Result Value   Resp Syncytial Virus by PCR POSITIVE (*)    All other components within normal limits  COMPREHENSIVE METABOLIC PANEL WITH GFR - Abnormal; Notable for the following components:   CO2 21 (*)    All other components within normal limits  CBC - Abnormal; Notable for the following components:   RBC 4.14 (*)    All other components within normal limits  BRAIN NATRIURETIC PEPTIDE   TROPONIN I (HIGH SENSITIVITY)    EKG EKG Interpretation Date/Time:  Thursday July 18 2023 09:15:00 EDT Ventricular Rate:  77 PR Interval:  135 QRS Duration:  96 QT Interval:  371 QTC Calculation: 420 R Axis:   -7  Text Interpretation: Sinus rhythm Abnormal R-wave progression, early transition No significant change since prior 3/20 Confirmed by Meridee Score [phone]) on 07/18/2023 9:16:56 AM  Radiology DG Chest Port 1 View Result Date: 07/18/2023 CLINICAL DATA:  Productive cough. EXAM: PORTABLE CHEST 1 VIEW COMPARISON:  None Available. FINDINGS: Bilateral lung fields are clear. Bilateral costophrenic angles are clear. Normal cardio-mediastinal silhouette. No acute osseous abnormalities. The soft tissues are within normal limits. Surgical staples noted overlying the right lower neck. IMPRESSION: No active disease. Electronically Signed   By: Jules Schick M.D.   On: 07/18/2023 10:56    Procedures Procedures    Medications Ordered in ED Medications  ketorolac (TORADOL) 15 MG/ML injection 15 mg (15 mg Intravenous Given 07/18/23 0943)  amoxicillin-clavulanate (AUGMENTIN) 875-125 MG per tablet 1 tablet (1 tablet Oral Given 07/18/23 1138)    ED Course/ Medical Decision Making/ A&P                                 Medical Decision Making Differential diagnosis includes but not limited to pneumonia, bronchitis, viral illness, CHF, ACS, PE, other  ED course: Patient here with about a month of productive cough, he does have sinus pressure and congestion managed with allergy nasal spray at home.  He does have some wheezing rhonchi exam.  He is not a smoker no history of pulmonary disease.  He is not hypoxic or tachycardic, no lower extremity swelling or tenderness.  No other VTE risk factors, doubt PE, suspect possible pneumonia, patient may need coverage with different antibiotic that he had previously.  Will obtain labs, x-ray and reevaluate.  Patient positive for RSV, BNP normal, troponin  negative, CMP shows slightly decreased CO2 at 21 otherwise normal, no leukocytosis or anemia on his CBC.  Chest x-ray shows no pulm edema or infiltrates per my interpretation, I agree with radiology read, EKG shows sinus rhythm, no ischemic changes.  Patient's symptoms likely due to RSV, he is also having sinus pressure and pain and productive cough, states he feels like he is having to clear his throat a lot.  He has been managing the pressure with OTC allergy nasal spray.  Discussed with patient I feel his productive cough is likely due to bacterial sinusitis, though the wheezing and cough also due to the RSV infection.  He treated with Augmentin for sinus infection, Tessalon Perles for cough, he already has an inhaler prescribed to him.  Advised on follow-up and return precautions.  We did discuss that lingering cough could last for couple of more weeks.  He is agreeable with plan of care  Amount and/or Complexity of Data Reviewed Labs: ordered. Radiology: ordered.  Risk Prescription drug management.           Final Clinical Impression(s) / ED Diagnoses Final diagnoses:  RSV (acute bronchiolitis due to respiratory syncytial virus)  Acute non-recurrent sinusitis, unspecified location    Rx / DC Orders ED Discharge Orders          Ordered    amoxicillin-clavulanate (AUGMENTIN) 875-125 MG tablet  Every 12 hours        07/18/23 1131    benzonatate (TESSALON) 100 MG capsule  Every 8 hours        07/18/23 1132              Josem Kaufmann 07/18/23 1149    Terrilee Files, MD 07/18/23 1734

## 2023-07-18 NOTE — ED Notes (Signed)
 X-ray at bedside

## 2023-07-18 NOTE — ED Triage Notes (Signed)
 Pt arrived via POV c/o on-going cough and feeling of SOB. Pt reports multiple visits to UC and PCP office w/o relief. Pt reports recently prescribed medications have not helped.

## 2023-07-18 NOTE — Telephone Encounter (Signed)
 We have only seen patient for OSA, not for any lung conditions. Patient currently in ED for evaluation of symptoms.

## 2023-07-19 ENCOUNTER — Encounter: Payer: Self-pay | Admitting: Internal Medicine

## 2023-07-19 DIAGNOSIS — M542 Cervicalgia: Secondary | ICD-10-CM | POA: Diagnosis not present

## 2023-07-19 DIAGNOSIS — M9901 Segmental and somatic dysfunction of cervical region: Secondary | ICD-10-CM | POA: Diagnosis not present

## 2023-07-19 DIAGNOSIS — M546 Pain in thoracic spine: Secondary | ICD-10-CM | POA: Diagnosis not present

## 2023-07-19 DIAGNOSIS — M9902 Segmental and somatic dysfunction of thoracic region: Secondary | ICD-10-CM | POA: Diagnosis not present

## 2023-07-19 MED ORDER — ALBUTEROL SULFATE HFA 108 (90 BASE) MCG/ACT IN AERS: 2.0000 | INHALATION_SPRAY | RESPIRATORY_TRACT | 0 refills | Status: DC | PRN

## 2023-08-06 NOTE — Telephone Encounter (Signed)
 Please verify the above. thanks

## 2023-08-07 DIAGNOSIS — G4733 Obstructive sleep apnea (adult) (pediatric): Secondary | ICD-10-CM | POA: Diagnosis not present

## 2023-08-08 ENCOUNTER — Other Ambulatory Visit: Payer: Self-pay | Admitting: Internal Medicine

## 2023-08-08 DIAGNOSIS — G4733 Obstructive sleep apnea (adult) (pediatric): Secondary | ICD-10-CM

## 2023-08-09 ENCOUNTER — Other Ambulatory Visit: Payer: Self-pay | Admitting: Internal Medicine

## 2023-08-09 DIAGNOSIS — G4733 Obstructive sleep apnea (adult) (pediatric): Secondary | ICD-10-CM

## 2023-08-09 MED ORDER — TIRZEPATIDE-WEIGHT MANAGEMENT 5 MG/0.5ML ~~LOC~~ SOAJ: 5.0000 mg | SUBCUTANEOUS | 3 refills | Status: DC

## 2023-08-11 ENCOUNTER — Encounter: Payer: Self-pay | Admitting: Internal Medicine

## 2023-08-12 DIAGNOSIS — M9901 Segmental and somatic dysfunction of cervical region: Secondary | ICD-10-CM | POA: Diagnosis not present

## 2023-08-12 DIAGNOSIS — M546 Pain in thoracic spine: Secondary | ICD-10-CM | POA: Diagnosis not present

## 2023-08-12 DIAGNOSIS — M9902 Segmental and somatic dysfunction of thoracic region: Secondary | ICD-10-CM | POA: Diagnosis not present

## 2023-08-12 DIAGNOSIS — M542 Cervicalgia: Secondary | ICD-10-CM | POA: Diagnosis not present

## 2023-08-20 ENCOUNTER — Other Ambulatory Visit: Payer: Self-pay | Admitting: Psychiatry

## 2023-08-20 DIAGNOSIS — F319 Bipolar disorder, unspecified: Secondary | ICD-10-CM

## 2023-08-20 NOTE — Telephone Encounter (Signed)
 I believe he has enough medication to last until his next appointment. Please let me know if he needs a refill at this time.
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
## 2023-08-28 ENCOUNTER — Other Ambulatory Visit: Payer: Self-pay

## 2023-08-28 DIAGNOSIS — G4733 Obstructive sleep apnea (adult) (pediatric): Secondary | ICD-10-CM

## 2023-08-28 NOTE — Progress Notes (Signed)
 Cpap ordered.

## 2023-08-30 ENCOUNTER — Telehealth: Payer: Self-pay

## 2023-08-30 ENCOUNTER — Ambulatory Visit: Payer: Self-pay | Admitting: Internal Medicine

## 2023-08-30 ENCOUNTER — Telehealth: Payer: Self-pay | Admitting: Psychiatry

## 2023-08-30 NOTE — Telephone Encounter (Signed)
 Copied from CRM [id number]. Topic: General - Call Back - No Documentation >> Aug 21, 2023 10:27 AM Hilton Lucky wrote: Reason for CRM: Patient returning call from call this morning. No documentation or indication of what call is about. Please return call to patient if needed.   I tried calling pt on 08-26-23 regarding sleep test results and CPAP. I sent pt a message on Mychart about this since I was unable to talk to him on the phone. Pt relied back to me and his CPAP was already ordered. NFN
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
## 2023-09-11 ENCOUNTER — Telehealth: Admitting: Psychiatry

## 2023-09-11 ENCOUNTER — Encounter: Payer: Self-pay | Admitting: Psychiatry

## 2023-09-11 DIAGNOSIS — F5089 Other specified eating disorder: Secondary | ICD-10-CM | POA: Diagnosis not present

## 2023-09-11 DIAGNOSIS — F319 Bipolar disorder, unspecified: Secondary | ICD-10-CM

## 2023-09-11 DIAGNOSIS — Z79899 Other long term (current) drug therapy: Secondary | ICD-10-CM

## 2023-09-11 DIAGNOSIS — M542 Cervicalgia: Secondary | ICD-10-CM | POA: Diagnosis not present

## 2023-09-11 DIAGNOSIS — F411 Generalized anxiety disorder: Secondary | ICD-10-CM

## 2023-09-11 DIAGNOSIS — M9901 Segmental and somatic dysfunction of cervical region: Secondary | ICD-10-CM | POA: Diagnosis not present

## 2023-09-11 DIAGNOSIS — M9902 Segmental and somatic dysfunction of thoracic region: Secondary | ICD-10-CM | POA: Diagnosis not present

## 2023-09-11 DIAGNOSIS — M546 Pain in thoracic spine: Secondary | ICD-10-CM | POA: Diagnosis not present

## 2023-09-11 MED ORDER — CARBAMAZEPINE 200 MG PO TABS: ORAL_TABLET | ORAL | 1 refills | Status: DC

## 2023-09-17 DIAGNOSIS — G4733 Obstructive sleep apnea (adult) (pediatric): Secondary | ICD-10-CM | POA: Diagnosis not present

## 2023-09-24 ENCOUNTER — Telehealth: Payer: Self-pay | Admitting: Primary Care

## 2023-09-24 NOTE — Telephone Encounter (Signed)
 Tried calling patient to get scheduled for CPAP compliance follow up. Needs to be scheduled between July 4 and September 1st.

## 2023-09-30 ENCOUNTER — Other Ambulatory Visit: Payer: Self-pay | Admitting: Internal Medicine

## 2023-09-30 DIAGNOSIS — E559 Vitamin D deficiency, unspecified: Secondary | ICD-10-CM

## 2023-09-30 DIAGNOSIS — Z79899 Other long term (current) drug therapy: Secondary | ICD-10-CM | POA: Diagnosis not present

## 2023-09-30 DIAGNOSIS — F319 Bipolar disorder, unspecified: Secondary | ICD-10-CM

## 2023-09-30 DIAGNOSIS — R7303 Prediabetes: Secondary | ICD-10-CM | POA: Diagnosis not present

## 2023-09-30 DIAGNOSIS — I10 Essential (primary) hypertension: Secondary | ICD-10-CM

## 2023-09-30 DIAGNOSIS — E782 Mixed hyperlipidemia: Secondary | ICD-10-CM | POA: Diagnosis not present

## 2023-09-30 DIAGNOSIS — E538 Deficiency of other specified B group vitamins: Secondary | ICD-10-CM

## 2023-10-01 ENCOUNTER — Encounter: Payer: Self-pay | Admitting: Internal Medicine

## 2023-10-01 ENCOUNTER — Ambulatory Visit: Payer: Self-pay | Admitting: Psychiatry

## 2023-10-01 ENCOUNTER — Ambulatory Visit (INDEPENDENT_AMBULATORY_CARE_PROVIDER_SITE_OTHER): Payer: Medicare Other | Admitting: Internal Medicine

## 2023-10-01 VITALS — BP 120/77 | HR 74 | Ht 73.0 in | Wt 245.4 lb

## 2023-10-01 DIAGNOSIS — I10 Essential (primary) hypertension: Secondary | ICD-10-CM | POA: Diagnosis not present

## 2023-10-01 DIAGNOSIS — K219 Gastro-esophageal reflux disease without esophagitis: Secondary | ICD-10-CM

## 2023-10-01 DIAGNOSIS — M940 Chondrocostal junction syndrome [Tietze]: Secondary | ICD-10-CM | POA: Insufficient documentation

## 2023-10-01 DIAGNOSIS — F319 Bipolar disorder, unspecified: Secondary | ICD-10-CM | POA: Diagnosis not present

## 2023-10-01 DIAGNOSIS — Z23 Encounter for immunization: Secondary | ICD-10-CM

## 2023-10-01 DIAGNOSIS — G4733 Obstructive sleep apnea (adult) (pediatric): Secondary | ICD-10-CM | POA: Diagnosis not present

## 2023-10-01 LAB — CBC WITH DIFFERENTIAL/PLATELET
Basophils Absolute: 0 10*3/uL (ref 0.0–0.2)
Basos: 1 %
EOS (ABSOLUTE): 0.1 10*3/uL (ref 0.0–0.4)
Eos: 2 %
Hematocrit: 45 % (ref 37.5–51.0)
Hemoglobin: 14.5 g/dL (ref 13.0–17.7)
Immature Grans (Abs): 0 10*3/uL (ref 0.0–0.1)
Immature Granulocytes: 0 %
Lymphocytes Absolute: 1.2 10*3/uL (ref 0.7–3.1)
Lymphs: 24 %
MCH: 33.3 pg — ABNORMAL HIGH (ref 26.6–33.0)
MCHC: 32.2 g/dL (ref 31.5–35.7)
MCV: 103 fL — ABNORMAL HIGH (ref 79–97)
Monocytes Absolute: 0.5 10*3/uL (ref 0.1–0.9)
Monocytes: 9 %
Neutrophils Absolute: 3.3 10*3/uL (ref 1.4–7.0)
Neutrophils: 64 %
Platelets: 188 10*3/uL (ref 150–450)
RBC: 4.35 x10E6/uL (ref 4.14–5.80)
RDW: 12.9 % (ref 11.6–15.4)
WBC: 5.1 10*3/uL (ref 3.4–10.8)

## 2023-10-01 LAB — LIPID PANEL
Chol/HDL Ratio: 2.7 ratio (ref 0.0–5.0)
Cholesterol, Total: 155 mg/dL (ref 100–199)
HDL: 58 mg/dL (ref >39)
LDL Chol Calc (NIH): 79 mg/dL (ref 0–99)
Triglycerides: 99 mg/dL (ref 0–149)
VLDL Cholesterol Cal: 18 mg/dL (ref 5–40)

## 2023-10-01 LAB — CMP14+EGFR
ALT: 27 IU/L (ref 0–44)
AST: 22 IU/L (ref 0–40)
Albumin: 4.5 g/dL (ref 3.9–4.9)
Alkaline Phosphatase: 80 IU/L (ref 44–121)
BUN/Creatinine Ratio: 18 (ref 10–24)
BUN: 15 mg/dL (ref 8–27)
Bilirubin Total: <0.2 mg/dL (ref 0.0–1.2)
CO2: 21 mmol/L (ref 20–29)
Calcium: 9.7 mg/dL (ref 8.6–10.2)
Chloride: 103 mmol/L (ref 96–106)
Creatinine, Ser: 0.84 mg/dL (ref 0.76–1.27)
Globulin, Total: 2.1 g/dL (ref 1.5–4.5)
Glucose: 89 mg/dL (ref 70–99)
Potassium: 4.8 mmol/L (ref 3.5–5.2)
Sodium: 141 mmol/L (ref 134–144)
Total Protein: 6.6 g/dL (ref 6.0–8.5)
eGFR: 97 mL/min/{1.73_m2} (ref >59)

## 2023-10-01 LAB — CARBAMAZEPINE LEVEL, TOTAL: Carbamazepine (Tegretol), S: 8.4 ug/mL (ref 4.0–12.0)

## 2023-10-01 LAB — HEMOGLOBIN A1C
Est. average glucose Bld gHb Est-mCnc: 108 mg/dL
Hgb A1c MFr Bld: 5.4 % (ref 4.8–5.6)

## 2023-10-01 LAB — VITAMIN B12: Vitamin B-12: >2000 pg/mL — ABNORMAL HIGH (ref 232–1245)

## 2023-10-01 MED ORDER — ZEPBOUND 10 MG/0.5ML ~~LOC~~ SOAJ: 10.0000 mg | SUBCUTANEOUS | 2 refills | Status: DC

## 2023-10-01 MED ORDER — ZEPBOUND 7.5 MG/0.5ML ~~LOC~~ SOAJ
7.5000 mg | SUBCUTANEOUS | 0 refills | Status: DC
Start: 2023-10-01 — End: 2024-01-02

## 2023-10-01 MED ORDER — NAPROXEN 500 MG PO TABS
500.0000 mg | ORAL_TABLET | Freq: Two times a day (BID) | ORAL | 1 refills | Status: AC
Start: 2023-10-01 — End: ?

## 2023-10-01 NOTE — Assessment & Plan Note (Signed)
 Overall well controlled with pantoprazole  40 mg QD
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
## 2023-10-01 NOTE — Assessment & Plan Note (Signed)
 BP Readings from Last 1 Encounters:  10/01/23 120/77   Well-controlled with Amlodipine  and Losartan  Counseled for compliance with the medications Advised DASH diet and moderate exercise/walking, at least 150 mins/week

## 2023-10-01 NOTE — Patient Instructions (Addendum)
 Please start taking Zepbound  7.5 mg once weekly for 4 weeks and then 10 mg once weekly.  Please take Naproxen only as needed for costochondral (chest) pain.  Please continue to take other medications as prescribed.  Please continue to follow low carb diet and perform moderate exercise/walking at least 150 mins/week.

## 2023-10-01 NOTE — Assessment & Plan Note (Signed)
 Chest wall pain and tenderness likely due to costochondritis EKG on the day of ER visit reviewed, sinus rhythm without any signs of acute ischemia Naproxen as needed for pain

## 2023-10-01 NOTE — Assessment & Plan Note (Signed)
 Uses CPAP regularly, continues to benefit from it Continue to follow low-carb diet  Recent studies ([facility]) showed significant improvement in OSA related apnea- hypopnea index with use of Zepbound  Patient is an excellent candidate for Zepbound , on 5 mg qw, plan to increase dose as tolerated - has lost 34 lbs weight with Zepbound 

## 2023-10-01 NOTE — Assessment & Plan Note (Signed)
 BMI Readings from Last 3 Encounters:  10/01/23 32.38 kg/m  07/18/23 35.56 kg/m  07/05/23 35.56 kg/m   Associated with HTN, OSA, GERD and HLD Diet modification and moderate exercise advised On Zepbound  for OSA

## 2023-10-01 NOTE — Assessment & Plan Note (Signed)
On Carbamazepine, Lexapro and Wellbutrin Follows up with Psychiatrist

## 2023-10-07 ENCOUNTER — Other Ambulatory Visit: Payer: Self-pay | Admitting: Internal Medicine

## 2023-10-07 DIAGNOSIS — E782 Mixed hyperlipidemia: Secondary | ICD-10-CM

## 2023-10-07 DIAGNOSIS — I10 Essential (primary) hypertension: Secondary | ICD-10-CM

## 2023-10-08 ENCOUNTER — Other Ambulatory Visit: Payer: Self-pay

## 2023-10-08 DIAGNOSIS — K219 Gastro-esophageal reflux disease without esophagitis: Secondary | ICD-10-CM

## 2023-10-08 MED ORDER — PANTOPRAZOLE SODIUM 40 MG PO TBEC
40.0000 mg | DELAYED_RELEASE_TABLET | Freq: Every day | ORAL | 1 refills | Status: DC
Start: 2023-10-08 — End: 2024-02-21

## 2023-10-09 ENCOUNTER — Telehealth: Payer: Self-pay

## 2023-10-09 ENCOUNTER — Other Ambulatory Visit: Payer: Self-pay | Admitting: Psychiatry

## 2023-10-09 MED ORDER — ESCITALOPRAM OXALATE 20 MG PO TABS: 20.0000 mg | ORAL_TABLET | Freq: Every day | ORAL | 1 refills | Status: DC

## 2023-10-09 NOTE — Telephone Encounter (Signed)
 pt lefty a message that he needs his medications to go to the [facility] mail order. pt was last seen on 5-28 next appt 8-27  Escitalopram , carbamazepine , bupropion 

## 2023-10-09 NOTE — Telephone Encounter (Signed)
 called pharmacy spoke wtih [facility] and the escialopram was last picked up on 2-25 and the refill was canceled. the carbamazepine  was last filled on 6-11 the refill was canceled. the bupropion  was last filled on 1-29 and the refill has been cancled.  Please send rx to the [facility] pharmacy

## 2023-10-09 NOTE — Telephone Encounter (Signed)
 Please notify him that I have ordered Lexapro  only at this time. He appears to have enough carbamazepine  to last until the next visit, and bupropion  has been discontinued.

## 2023-10-10 NOTE — Telephone Encounter (Signed)
 Pt.notified

## 2023-10-17 DIAGNOSIS — G4733 Obstructive sleep apnea (adult) (pediatric): Secondary | ICD-10-CM | POA: Diagnosis not present

## 2023-10-24 DIAGNOSIS — L570 Actinic keratosis: Secondary | ICD-10-CM | POA: Diagnosis not present

## 2023-10-24 DIAGNOSIS — D485 Neoplasm of uncertain behavior of skin: Secondary | ICD-10-CM | POA: Diagnosis not present

## 2023-10-24 DIAGNOSIS — D2261 Melanocytic nevi of right upper limb, including shoulder: Secondary | ICD-10-CM | POA: Diagnosis not present

## 2023-10-24 DIAGNOSIS — Z85828 Personal history of other malignant neoplasm of skin: Secondary | ICD-10-CM | POA: Diagnosis not present

## 2023-10-24 DIAGNOSIS — L814 Other melanin hyperpigmentation: Secondary | ICD-10-CM | POA: Diagnosis not present

## 2023-10-24 DIAGNOSIS — C44612 Basal cell carcinoma of skin of right upper limb, including shoulder: Secondary | ICD-10-CM | POA: Diagnosis not present

## 2023-11-15 DIAGNOSIS — M546 Pain in thoracic spine: Secondary | ICD-10-CM | POA: Diagnosis not present

## 2023-11-15 DIAGNOSIS — M9902 Segmental and somatic dysfunction of thoracic region: Secondary | ICD-10-CM | POA: Diagnosis not present

## 2023-11-15 DIAGNOSIS — M62830 Muscle spasm of back: Secondary | ICD-10-CM | POA: Diagnosis not present

## 2023-11-15 DIAGNOSIS — M9903 Segmental and somatic dysfunction of lumbar region: Secondary | ICD-10-CM | POA: Diagnosis not present

## 2023-11-17 DIAGNOSIS — G4733 Obstructive sleep apnea (adult) (pediatric): Secondary | ICD-10-CM | POA: Diagnosis not present

## 2023-11-19 ENCOUNTER — Encounter: Payer: Self-pay | Admitting: Primary Care

## 2023-11-19 ENCOUNTER — Ambulatory Visit: Admitting: Primary Care

## 2023-11-19 VITALS — BP 126/77 | HR 76 | Temp 98.2°F | Ht 73.0 in | Wt 235.0 lb

## 2023-11-19 DIAGNOSIS — Z6831 Body mass index (BMI) 31.0-31.9, adult: Secondary | ICD-10-CM

## 2023-11-19 DIAGNOSIS — G4733 Obstructive sleep apnea (adult) (pediatric): Secondary | ICD-10-CM

## 2023-11-19 DIAGNOSIS — F502 Bulimia nervosa, unspecified: Secondary | ICD-10-CM

## 2023-11-19 NOTE — Progress Notes (Signed)
 @Patient  ID: Calvin Cole, male    DOB: Dec 19, 1955, 68 y.o.   MRN: [id number]  Chief Complaint  Patient presents with   Sleep Apnea    Dry mouth sometimes    Referring provider: Tobie Suzzane POUR, MD  HPI: 68 year old male. PMH significant for HTN, moderate OSA GERD, bipolar disorder, bulimia, HLD, lumbar stenosis, obesity. Former patient of Dr. Shellia  Previous [facility] pulmonary encounter: 06/03/2023 Patient presents today for regular OV/OSA on CPAP.   Discussed the use of AI scribe software for clinical note transcription with the patient, who gave verbal consent to proceed.  History of Present Illness   Calvin Cole is a 69 year old male with sleep apnea who presents for CPAP machine evaluation and potential replacement. His wife confirms that he is quiet during sleep.  He has a history of sleep apnea diagnosed in 2016 following a sleep study at [facility]. His apneas are well controlled with the CPAP, and he does not experience episodes of snoring or waking up gasping. He feels refreshed upon waking and has noticed significant benefits from using the CPAP.  He has been using a CPAP machine for approximately nine years, which is his first and only unit. The machine is still functioning well, and he uses it every night. Occasionally, he experiences issues with the nasal pillows dislodging if he rolls over, but he readjusts them and returns to sleep without further problems. He travels frequently for work and takes his CPAP machine with him, although he is interested in a smaller travel unit due to space constraints in his luggage. He uses a ResMed CPAP machine with a nasal pillow mask, specifically an NI30 model.  His weight has fluctuated since his initial sleep study, with an overall increase. He has not tried weight loss medications, although he has previously used phentermine  with temporary success. He has stopped drinking alcohol, which he notes contributed to weight gain. He is currently  working out at [employer].  He has a history of bulimia and binge eating, which he manages with the help of his psychiatric doctor. He was previously prescribed bupropion , which initially helped but is no longer effective. He maintains regular contact with his healthcare providers.      11/19/2023 - Interim hx  History of Present Illness  Discussed the use of AI scribe software for clinical note transcription with the patient, who gave verbal consent to proceed.  History of Present Illness   Calvin Cole is a 68 year old male with obesity and severe sleep apnea who presents for a follow-up visit.  He has experienced significant weight loss since his last visit in February 2025, losing approximately 40 to 50 pounds. This weight loss is attributed to the use of Zepbound  injections, exercise, and dietary changes. He notes improvements in various aspects of his life, including sleep and overall well-being.  He has a history of bulimia, characterized by binge eating followed by purging. Wellbutrin  previously helped reduce the urge to overeat and purge, but since starting Zepbound , he finds it physically difficult to overeat due to prolonged satiety. He is no longer on Wellbutrin  and is tapering off other psychiatric medications as he feels his symptoms have improved.  He has severe sleep apnea, with a sleep study in March showing 34 apneas per hour when his weight was 277 pounds. He currently uses a CPAP machine with pressure settings of 4 to 14, which has reduced his apnea score to 0.7. He reports  improved snoring and no issues when using the CPAP, though he experiences dry mouth due to wearing a dental appliance. He uses a chin strap to keep his mouth closed during sleep.  He is currently on Zepbound  injections, which have significantly reduced his appetite and 'food noise,' allowing him to manage his weight more effectively.  He is semi-retired and travels frequently for work.     Allergies   Allergen Reactions   Sulfa Antibiotics Other (See Comments)    fever    Immunization History  Administered Date(s) Administered   Fluzone Influenza virus vaccine,trivalent (IIV3), split virus 12/10/2017   Influenza, Seasonal, Injecte, Preservative Fre 03/21/2023   Influenza,inj,Quad PF,6+ Mos 02/14/2017, 12/10/2017, 01/09/2022   Influenza-Unspecified 01/01/2013, 01/21/2014, 01/20/2015, 01/09/2019, 03/24/2021   Moderna Covid-19 Vaccine Bivalent Booster 32yrs & up 03/24/2021   Moderna Sars-Covid-2 Vaccination 04/27/2019, 05/25/2019   PNEUMOCOCCAL CONJUGATE-20 10/01/2023   PPD Test 01/08/2020   Tdap 05/21/2013   Zoster Recombinant(Shingrix ) 05/29/2021, 02/01/2022   Zoster, Live 12/26/2010    Past Medical History:  Diagnosis Date   Achilles tendinitis 05/23/2020   Anxiety    Arthritis    Bipolar 1 disorder (HCC)    Bulimia nervosa    GERD (gastroesophageal reflux disease)    High cholesterol    History of kidney stones    H/O   History of methicillin resistant staphylococcus aureus (MRSA) 2007   Hypertension    Melanoma (HCC)    [facility]/Dr. Clark/followed by Dr. Skeeter   Postoperative seroma 04/13/2014   Sleep apnea    Spinal stenosis of lumbar region     Tobacco History: Social History   Tobacco Use  Smoking Status Never  Smokeless Tobacco Never   Counseling given: Not Answered   Outpatient Medications Prior to Visit  Medication Sig Dispense Refill   albuterol  (VENTOLIN  HFA) 108 (90 Base) MCG/ACT inhaler Inhale 2 puffs into the lungs every 4 (four) hours as needed. 18 g 0   amLODipine  (NORVASC ) 5 MG tablet TAKE 1 TABLET(5 MG) BY MOUTH DAILY 90 tablet 3   aspirin  EC 81 MG tablet Take 81 mg by mouth daily.     atorvastatin  (LIPITOR) 40 MG tablet Take 1 tablet by mouth daily. 90 tablet 1   carbamazepine  (TEGRETOL ) 200 MG tablet Take 2 tablets (400 mg total) by mouth daily AND 1 tablet (200 mg total) every evening. 270 tablet 1   escitalopram  (LEXAPRO ) 20 MG tablet  Take 1 tablet (20 mg total) by mouth daily. 90 tablet 1   guaiFENesin  (ROBITUSSIN) 100 MG/5ML liquid Take 10 mLs by mouth every 6 (six) hours as needed for cough or to loosen phlegm. 300 mL 0   levocetirizine (XYZAL ) 5 MG tablet Take 1 tablet (5 mg total) by mouth every evening. 30 tablet 0   losartan  (COZAAR ) 100 MG tablet Take 1 tablet by mouth daily. 90 tablet 3   multivitamin (PROSIGHT) TABS tablet Take 1 tablet by mouth daily. Patient should resume this as a home medication. No prescription provided at discharge. 30 each 0   naproxen  (NAPROSYN ) 500 MG tablet Take 1 tablet (500 mg total) by mouth 2 (two) times daily with a meal. 60 tablet 1   pantoprazole  (PROTONIX ) 40 MG tablet Take 1 tablet (40 mg total) by mouth daily. 90 tablet 1   sodium chloride  (OCEAN) 0.65 % SOLN nasal spray Place 1 spray into both nostrils as needed for congestion. 15 mL 0   tadalafil  (CIALIS ) 10 MG tablet Take 1 tablet (10 mg total) by  mouth daily as needed for erectile dysfunction. 30 tablet 1   tirzepatide  (ZEPBOUND ) 10 MG/0.5ML Pen Inject 10 mg into the skin once a week. 2 mL 2   benzonatate  (TESSALON ) 100 MG capsule Take 1 capsule (100 mg total) by mouth every 8 (eight) hours. (Patient not taking: Reported on 11/19/2023) 21 capsule 0   fluticasone  (FLONASE ) 50 MCG/ACT nasal spray Place 2 sprays into both nostrils daily. (Patient not taking: Reported on 11/19/2023) 16 g 0   tirzepatide  (ZEPBOUND ) 7.5 MG/0.5ML Pen Inject 7.5 mg into the skin once a week. (Patient not taking: Reported on 11/19/2023) 2 mL 0   buPROPion  (WELLBUTRIN  XL) 150 MG 24 hr tablet Take 1 tablet (150 mg total) by mouth daily. (Patient not taking: Reported on 11/19/2023) 90 tablet 0   No facility-administered medications prior to visit.   Review of Systems  Review of Systems  Constitutional: Negative.   Respiratory: Negative.     Physical Exam  BP 126/77   Pulse 76   Temp 98.2 F (36.8 C)   Ht 6' 1 (1.854 m)   Wt 235 lb (106.6 kg)   SpO2  96% Comment: ra  BMI 31.00 kg/m  Physical Exam Constitutional:      Appearance: Normal appearance. He is well-developed.  HENT:     Head: Normocephalic and atraumatic.     Mouth/Throat:     Mouth: Mucous membranes are moist.     Pharynx: Oropharynx is clear.  Cardiovascular:     Rate and Rhythm: Normal rate and regular rhythm.     Heart sounds: Normal heart sounds.  Pulmonary:     Effort: Pulmonary effort is normal. No respiratory distress.     Breath sounds: Normal breath sounds. No wheezing or rhonchi.  Musculoskeletal:        General: Normal range of motion.     Cervical back: Normal range of motion and neck supple.  Skin:    General: Skin is warm and dry.     Findings: No erythema or rash.  Neurological:     General: No focal deficit present.     Mental Status: He is alert and oriented to person, place, and time. Mental status is at baseline.  Psychiatric:        Mood and Affect: Mood normal.        Behavior: Behavior normal.        Thought Content: Thought content normal.        Judgment: Judgment normal.     Lab Results:  CBC    Component Value Date/Time   WBC 5.1 09/30/2023 0816   WBC 5.5 07/18/2023 0941   RBC 4.35 09/30/2023 0816   RBC 4.14 (L) 07/18/2023 0941   HGB 14.5 09/30/2023 0816   HCT 45.0 09/30/2023 0816   PLT 188 09/30/2023 0816   MCV 103 (H) 09/30/2023 0816   MCH 33.3 (H) 09/30/2023 0816   MCH 33.8 07/18/2023 0941   MCHC 32.2 09/30/2023 0816   MCHC 33.8 07/18/2023 0941   RDW 12.9 09/30/2023 0816   LYMPHSABS 1.2 09/30/2023 0816   MONOABS 0.4 06/18/2018 1436   EOSABS 0.1 09/30/2023 0816   BASOSABS 0.0 09/30/2023 0816    BMET    Component Value Date/Time   NA 141 09/30/2023 0816   K 4.8 09/30/2023 0816   CL 103 09/30/2023 0816   CO2 21 09/30/2023 0816   GLUCOSE 89 09/30/2023 0816   GLUCOSE 88 07/18/2023 0941   BUN 15 09/30/2023 0816   CREATININE 0.84  09/30/2023 0816   CREATININE 0.79 06/22/2017 1057   CALCIUM  9.7 09/30/2023 0816    GFRNONAA >60 07/18/2023 0941   GFRNONAA 88 01/17/2017 1146   GFRAA >60 06/18/2018 1436   GFRAA 102 01/17/2017 1146    BNP    Component Value Date/Time   BNP 24.0 07/18/2023 0941    ProBNP No results found for: PROBNP  Imaging: No results found.   Assessment & Plan:    Assessment and Plan Assessment & Plan   Assessment and Plan    Obstructive sleep apnea in the setting of obesity, on CPAP and Zepbound  therapy Severe obstructive sleep apnea diagnosed in March with 34 apneas per hour. Current weight loss has improved symptoms, with apnea score reduced to 0.7 when using CPAP. Snoring has improved, and there is no obstruction noted when lying back. CPAP settings are effective, but he experiences dry mouth due to a dental appliance. He is interested in potentially discontinuing CPAP therapy if further weight loss is achieved. Zepbound  is the only FDA-approved medication for OSA management through weight loss. - Continue CPAP therapy with current settings of 4 to 14 cm H2O. - Consider repeating sleep study upon reaching weight loss goal to evaluate the necessity of continued CPAP therapy. - Maintain Zepbound  therapy at 10mg   - Check in six months to assess progress and consider repeating sleep study if weight loss goal is achieved.  Obesity, responding to Zepbound  therapy and lifestyle modification Significant weight loss of 40 to 50 pounds since February, attributed to Zepbound  therapy, diet, and exercise. Current weight is approximately 13 to 15 pounds from the target goal of 220 pounds. Zepbound  has effectively reduced appetite and food noise, facilitating weight loss and improving overall quality of life. Zepbound  is a lifelong therapy and will not be discontinued even if sleep apnea resolves. - Continue Zepbound  therapy at 10 mg, as it is effective in managing weight and approved for OSA. - Continue lifestyle modifications including diet and exercise. - Reassess weight and  consider repeating sleep study upon reaching weight loss goal.  Bulimia nervosa, in remission with pharmacotherapy and behavioral interventions Previous treatment with Wellbutrin  reduced urges to overeat and purge. Zepbound  has further helped by preventing overeating due to prolonged satiety. He reports significant improvement in symptoms and quality of life. - Continue Zepbound  therapy as it supports remission of bulimia nervosa by reducing overeating tendencies.   Almarie LELON Ferrari, NP 11/19/2023

## 2023-11-19 NOTE — Patient Instructions (Signed)
  VISIT SUMMARY: During your follow-up visit, we discussed your significant weight loss, improvements in sleep apnea, and the positive impact of your current treatments on your overall well-being. You have lost approximately 40 to 50 pounds since February 2025, which has greatly improved your sleep apnea symptoms and overall quality of life. We also reviewed your history of bulimia nervosa and how your current treatment is helping to manage it.  YOUR PLAN: -OBSTRUCTIVE SLEEP APNEA: Obstructive sleep apnea is a condition where your breathing stops and starts repeatedly during sleep due to blocked airways. Your weight loss has significantly improved your symptoms, reducing your apnea score from 34 to 0.7 when using your CPAP machine. Continue using your CPAP machine with the current settings of 4 to 14 cm H2O. We will consider repeating your sleep study once you reach your weight loss goal to see if you can discontinue CPAP therapy. Maintain your Zepbound  therapy as it helps manage sleep apnea through weight loss. We will check in six months to assess your progress.  -OBESITY: Obesity is a condition characterized by excessive body fat. You have lost 40 to 50 pounds since February, thanks to Zepbound  therapy, diet, and exercise. You are now 13 to 15 pounds away from your target weight of 220 pounds. Continue with Zepbound  therapy at 10 mg, as it effectively manages your weight and is approved for sleep apnea. Keep up with your diet and exercise routine. We will reassess your weight and consider repeating your sleep study once you reach your weight loss goal.  -BULIMIA NERVOSA: Bulimia nervosa is an eating disorder characterized by binge eating followed by purging. Your previous treatment with Wellbutrin  helped reduce these urges, and Zepbound  has further helped by preventing overeating due to prolonged satiety. Continue with Zepbound  therapy as it supports the remission of bulimia nervosa by reducing overeating  tendencies.  INSTRUCTIONS: We will check in six months to assess your progress and consider repeating your sleep study if you reach your weight loss goal.  Follow-up 6 months with [facility]ry NP virtual or at [address] location in [place]
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
## 2023-12-11 ENCOUNTER — Telehealth (INDEPENDENT_AMBULATORY_CARE_PROVIDER_SITE_OTHER): Admitting: Psychiatry

## 2023-12-11 ENCOUNTER — Encounter: Payer: Self-pay | Admitting: Psychiatry

## 2023-12-11 DIAGNOSIS — F319 Bipolar disorder, unspecified: Secondary | ICD-10-CM

## 2023-12-11 DIAGNOSIS — F419 Anxiety disorder, unspecified: Secondary | ICD-10-CM | POA: Diagnosis not present

## 2023-12-11 NOTE — Patient Instructions (Signed)
 Continue carbamazepine  400 mg AM, 200 mg PM  Continue lexapro  20 mg daily  Next appointment:11/14 at 9 30

## 2023-12-18 DIAGNOSIS — G4733 Obstructive sleep apnea (adult) (pediatric): Secondary | ICD-10-CM | POA: Diagnosis not present

## 2023-12-30 ENCOUNTER — Encounter: Payer: Self-pay | Admitting: Internal Medicine

## 2024-01-02 ENCOUNTER — Encounter: Payer: Self-pay | Admitting: Internal Medicine

## 2024-01-02 ENCOUNTER — Ambulatory Visit (INDEPENDENT_AMBULATORY_CARE_PROVIDER_SITE_OTHER): Admitting: Internal Medicine

## 2024-01-02 VITALS — BP 138/79 | HR 73 | Ht 74.0 in | Wt 230.6 lb

## 2024-01-02 DIAGNOSIS — Z23 Encounter for immunization: Secondary | ICD-10-CM

## 2024-01-02 DIAGNOSIS — Z0000 Encounter for general adult medical examination without abnormal findings: Secondary | ICD-10-CM | POA: Diagnosis not present

## 2024-01-02 DIAGNOSIS — I10 Essential (primary) hypertension: Secondary | ICD-10-CM | POA: Diagnosis not present

## 2024-01-02 DIAGNOSIS — G4733 Obstructive sleep apnea (adult) (pediatric): Secondary | ICD-10-CM | POA: Diagnosis not present

## 2024-01-02 DIAGNOSIS — R7303 Prediabetes: Secondary | ICD-10-CM

## 2024-01-02 DIAGNOSIS — E559 Vitamin D deficiency, unspecified: Secondary | ICD-10-CM

## 2024-01-02 MED ORDER — ZEPBOUND 12.5 MG/0.5ML ~~LOC~~ SOAJ: 12.5000 mg | SUBCUTANEOUS | 0 refills | Status: DC

## 2024-01-02 MED ORDER — SPIKEVAX 50 MCG/0.5ML IM SUSY: 0.5000 mL | PREFILLED_SYRINGE | Freq: Once | INTRAMUSCULAR | 0 refills | Status: AC

## 2024-01-02 NOTE — Assessment & Plan Note (Signed)
 Screening questionnaire reviewed. Age appropriate screenings and vaccinations discussed. Needs to schedule GI appt for colonoscopy.

## 2024-01-02 NOTE — Patient Instructions (Signed)
 Please continue to take medications as prescribed.  Please continue to follow low carb diet and perform moderate exercise/walking at least 150 mins/week.  Please get fasting blood tests done before the next visit.

## 2024-01-02 NOTE — Assessment & Plan Note (Signed)
 BMI Readings from Last 3 Encounters:  01/02/24 29.61 kg/m  11/19/23 31.00 kg/m  10/01/23 32.38 kg/m   BMI was 35.56 when Zepbound  was started Associated with HTN, OSA, GERD and HLD Diet modification and moderate exercise advised On Zepbound  for OSA
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
## 2024-01-02 NOTE — Assessment & Plan Note (Addendum)
 Uses CPAP regularly, continues to benefit from it Continue to follow low-carb diet  Recent studies ([facility]) showed significant improvement in OSA related apnea- hypopnea index with use of Zepbound  Patient is an excellent candidate for Zepbound , on 10 mg qw, plan to increase dose as tolerated - has lost about 49 lbs weight with Zepbound , has seen plateau in weight loss recently, increased dose to 12.5 mg qw

## 2024-01-02 NOTE — Assessment & Plan Note (Signed)
 BP Readings from Last 1 Encounters:  01/02/24 138/79   Well-controlled with Amlodipine  and Losartan  Counseled for compliance with the medications Advised DASH diet and moderate exercise/walking, at least 150 mins/week

## 2024-01-14 ENCOUNTER — Encounter: Payer: Self-pay | Admitting: Internal Medicine

## 2024-01-15 DIAGNOSIS — M9903 Segmental and somatic dysfunction of lumbar region: Secondary | ICD-10-CM | POA: Diagnosis not present

## 2024-01-15 DIAGNOSIS — M9902 Segmental and somatic dysfunction of thoracic region: Secondary | ICD-10-CM | POA: Diagnosis not present

## 2024-01-15 DIAGNOSIS — M62830 Muscle spasm of back: Secondary | ICD-10-CM | POA: Diagnosis not present

## 2024-01-15 DIAGNOSIS — M546 Pain in thoracic spine: Secondary | ICD-10-CM | POA: Diagnosis not present

## 2024-02-17 ENCOUNTER — Encounter: Payer: Self-pay | Admitting: Radiology

## 2024-02-21 ENCOUNTER — Other Ambulatory Visit: Payer: Self-pay | Admitting: Internal Medicine

## 2024-02-21 DIAGNOSIS — K219 Gastro-esophageal reflux disease without esophagitis: Secondary | ICD-10-CM

## 2024-02-24 ENCOUNTER — Other Ambulatory Visit: Payer: Self-pay | Admitting: Internal Medicine

## 2024-02-24 DIAGNOSIS — K219 Gastro-esophageal reflux disease without esophagitis: Secondary | ICD-10-CM

## 2024-02-24 DIAGNOSIS — E782 Mixed hyperlipidemia: Secondary | ICD-10-CM

## 2024-02-28 ENCOUNTER — Telehealth: Admitting: Psychiatry

## 2024-02-29 ENCOUNTER — Other Ambulatory Visit: Payer: Self-pay | Admitting: Psychiatry

## 2024-03-16 ENCOUNTER — Other Ambulatory Visit: Payer: Self-pay | Admitting: Internal Medicine

## 2024-03-16 DIAGNOSIS — G4733 Obstructive sleep apnea (adult) (pediatric): Secondary | ICD-10-CM

## 2024-03-23 DIAGNOSIS — K5900 Constipation, unspecified: Secondary | ICD-10-CM | POA: Diagnosis not present

## 2024-03-23 DIAGNOSIS — Z860101 Personal history of adenomatous and serrated colon polyps: Secondary | ICD-10-CM | POA: Diagnosis not present

## 2024-04-04 ENCOUNTER — Other Ambulatory Visit: Payer: Self-pay | Admitting: Psychiatry

## 2024-04-05 NOTE — Telephone Encounter (Signed)
 Please ask him if he needs this refill this time.

## 2024-04-06 ENCOUNTER — Other Ambulatory Visit: Payer: Self-pay | Admitting: Psychiatry

## 2024-04-06 ENCOUNTER — Telehealth: Payer: Self-pay

## 2024-04-06 DIAGNOSIS — F319 Bipolar disorder, unspecified: Secondary | ICD-10-CM

## 2024-04-06 MED ORDER — CARBAMAZEPINE 200 MG PO TABS: ORAL_TABLET | ORAL | 1 refills | Status: AC

## 2024-04-06 NOTE — Telephone Encounter (Signed)
 Pt reports he needs refill on his carbamazepine  (TEGRETOL ) 200 MG table  Sent to  [facility] [phone] - [place], [place] - [address] AT [facility] [address]  Pt last seen 12/11/23 Next apt on 04/15/24

## 2024-04-06 NOTE — Telephone Encounter (Signed)
 Patient called by CMA

## 2024-04-06 NOTE — Telephone Encounter (Signed)
 Pt verbalized he did not need them at this time.

## 2024-04-07 ENCOUNTER — Telehealth: Admitting: Psychiatry

## 2024-04-11 NOTE — Progress Notes (Unsigned)
 This encounter was created in error - please disregard.

## 2024-04-15 ENCOUNTER — Telehealth: Payer: Self-pay | Admitting: Psychiatry

## 2024-04-15 ENCOUNTER — Encounter: Admitting: Psychiatry

## 2024-04-15 NOTE — Telephone Encounter (Signed)
 Although he answered the call, he states that he is in [place]. He agreed to reschedule the visit.

## 2024-05-01 ENCOUNTER — Other Ambulatory Visit: Payer: Self-pay | Admitting: Internal Medicine

## 2024-05-01 ENCOUNTER — Other Ambulatory Visit: Payer: Self-pay | Admitting: Psychiatry

## 2024-05-01 DIAGNOSIS — F319 Bipolar disorder, unspecified: Secondary | ICD-10-CM

## 2024-05-01 DIAGNOSIS — I10 Essential (primary) hypertension: Secondary | ICD-10-CM

## 2024-05-06 ENCOUNTER — Ambulatory Visit: Admitting: Internal Medicine

## 2024-05-10 ENCOUNTER — Other Ambulatory Visit: Payer: Self-pay | Admitting: Internal Medicine

## 2024-05-10 DIAGNOSIS — E782 Mixed hyperlipidemia: Secondary | ICD-10-CM

## 2024-05-21 ENCOUNTER — Encounter: Payer: Self-pay | Admitting: Internal Medicine

## 2024-05-21 ENCOUNTER — Ambulatory Visit: Admitting: Internal Medicine

## 2024-05-21 VITALS — BP 129/75 | HR 77 | Resp 16 | Ht 74.0 in | Wt 227.0 lb

## 2024-05-21 DIAGNOSIS — F319 Bipolar disorder, unspecified: Secondary | ICD-10-CM

## 2024-05-21 DIAGNOSIS — F502 Bulimia nervosa, unspecified: Secondary | ICD-10-CM

## 2024-05-21 DIAGNOSIS — Z981 Arthrodesis status: Secondary | ICD-10-CM

## 2024-05-21 DIAGNOSIS — I10 Essential (primary) hypertension: Secondary | ICD-10-CM

## 2024-05-21 DIAGNOSIS — E782 Mixed hyperlipidemia: Secondary | ICD-10-CM

## 2024-05-21 DIAGNOSIS — G4733 Obstructive sleep apnea (adult) (pediatric): Secondary | ICD-10-CM

## 2024-05-21 DIAGNOSIS — K219 Gastro-esophageal reflux disease without esophagitis: Secondary | ICD-10-CM

## 2024-05-21 MED ORDER — ZEPBOUND 15 MG/0.5ML ~~LOC~~ SOAJ: 15.0000 mg | SUBCUTANEOUS | 1 refills | Status: AC

## 2024-05-21 NOTE — Patient Instructions (Addendum)
 Please start taking Zepbound  15 mg once weekly after completing 12.5 mg doses.  Please continue to take medications as prescribed.  Please continue to follow low carb diet and perform moderate exercise/walking at least 150 mins/week.
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
## 2024-05-21 NOTE — Assessment & Plan Note (Signed)
 BP Readings from Last 1 Encounters:  05/21/24 129/75   Well-controlled with Amlodipine  and Losartan  Counseled for compliance with the medications Advised DASH diet and moderate exercise/walking, at least 150 mins/week

## 2024-05-21 NOTE — Assessment & Plan Note (Signed)
 Followed by psychiatry Advised to continue portion control On carbamazepine  and Lexapro  for bipolar disorder

## 2024-05-21 NOTE — Assessment & Plan Note (Signed)
 Overall well controlled with pantoprazole  40 mg QD

## 2024-05-21 NOTE — Assessment & Plan Note (Addendum)
 Lipid profile reviewed Continue Atorvastatin  40 mg qHS

## 2024-05-21 NOTE — Assessment & Plan Note (Signed)
 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

## 2024-05-21 NOTE — Assessment & Plan Note (Signed)
 On Carbamazepine  and Lexapro  Follows up with Psychiatrist

## 2024-05-21 NOTE — Assessment & Plan Note (Signed)
 Uses CPAP regularly, continues to benefit from it Continue to follow low-carb diet  Clinical studies ([facility]) showed significant improvement in OSA related apnea- hypopnea index with use of Zepbound  Patient is an excellent candidate for Zepbound , on 12.5 mg qw, plan to increase dose as tolerated - has lost about 52 lbs weight with Zepbound , has seen plateau in weight loss recently, increased dose to 15 mg qw

## 2024-05-22 ENCOUNTER — Ambulatory Visit: Payer: Self-pay | Admitting: Internal Medicine

## 2024-05-22 LAB — CBC WITH DIFFERENTIAL/PLATELET
Basophils Absolute: 0.1 10*3/uL (ref 0.0–0.2)
Basos: 1 %
EOS (ABSOLUTE): 0.1 10*3/uL (ref 0.0–0.4)
Eos: 2 %
Hematocrit: 42.3 % (ref 37.5–51.0)
Hemoglobin: 14 g/dL (ref 13.0–17.7)
Immature Grans (Abs): 0 10*3/uL (ref 0.0–0.1)
Immature Granulocytes: 0 %
Lymphocytes Absolute: 1.4 10*3/uL (ref 0.7–3.1)
Lymphs: 29 %
MCH: 34.4 pg — ABNORMAL HIGH (ref 26.6–33.0)
MCHC: 33.1 g/dL (ref 31.5–35.7)
MCV: 104 fL — ABNORMAL HIGH (ref 79–97)
Monocytes Absolute: 0.5 10*3/uL (ref 0.1–0.9)
Monocytes: 10 %
Neutrophils Absolute: 2.8 10*3/uL (ref 1.4–7.0)
Neutrophils: 58 %
Platelets: 218 10*3/uL (ref 150–450)
RBC: 4.07 x10E6/uL — ABNORMAL LOW (ref 4.14–5.80)
RDW: 12.7 % (ref 11.6–15.4)
WBC: 4.9 10*3/uL (ref 3.4–10.8)

## 2024-05-22 LAB — HEMOGLOBIN A1C
Est. average glucose Bld gHb Est-mCnc: 100 mg/dL
Hgb A1c MFr Bld: 5.1 % (ref 4.8–5.6)

## 2024-05-22 LAB — CMP14+EGFR
ALT: 44 [IU]/L (ref 0–44)
AST: 36 [IU]/L (ref 0–40)
Albumin: 4.4 g/dL (ref 3.9–4.9)
Alkaline Phosphatase: 73 [IU]/L (ref 47–123)
BUN/Creatinine Ratio: 19 (ref 10–24)
BUN: 15 mg/dL (ref 8–27)
Bilirubin Total: 0.2 mg/dL (ref 0.0–1.2)
CO2: 23 mmol/L (ref 20–29)
Calcium: 9.4 mg/dL (ref 8.6–10.2)
Chloride: 103 mmol/L (ref 96–106)
Creatinine, Ser: 0.8 mg/dL (ref 0.76–1.27)
Globulin, Total: 2.3 g/dL (ref 1.5–4.5)
Glucose: 77 mg/dL (ref 70–99)
Potassium: 4.9 mmol/L (ref 3.5–5.2)
Sodium: 141 mmol/L (ref 134–144)
Total Protein: 6.7 g/dL (ref 6.0–8.5)
eGFR: 98 mL/min/{1.73_m2}

## 2024-05-22 LAB — VITAMIN D 25 HYDROXY (VIT D DEFICIENCY, FRACTURES): Vit D, 25-Hydroxy: 76.4 ng/mL (ref 30.0–100.0)

## 2024-05-22 LAB — TSH: TSH: 3.14 u[IU]/mL (ref 0.450–4.500)

## 2024-05-29 ENCOUNTER — Telehealth: Admitting: Psychiatry

## 2024-06-19 ENCOUNTER — Ambulatory Visit: Admitting: Internal Medicine

## 2024-11-19 ENCOUNTER — Ambulatory Visit: Payer: Self-pay | Admitting: Internal Medicine
